# Patient Record
Sex: Female | Born: 1980 | Race: White | Hispanic: No | Marital: Married | Smoking: Former smoker
Health system: Southern US, Community
[De-identification: ages and names within clinical notes are randomized; demographics above are authoritative.]

## PROBLEM LIST (undated history)

## (undated) DIAGNOSIS — D649 Anemia, unspecified: Secondary | ICD-10-CM

## (undated) DIAGNOSIS — Z8489 Family history of other specified conditions: Secondary | ICD-10-CM

## (undated) DIAGNOSIS — N200 Calculus of kidney: Secondary | ICD-10-CM

## (undated) HISTORY — PX: OTHER SURGICAL HISTORY: SHX169

---

## 2001-02-13 ENCOUNTER — Encounter: Payer: Self-pay | Admitting: *Deleted

## 2001-02-13 ENCOUNTER — Emergency Department (HOSPITAL_COMMUNITY): Admission: EM | Admit: 2001-02-13 | Discharge: 2001-02-13 | Payer: Self-pay | Admitting: *Deleted

## 2002-11-18 ENCOUNTER — Encounter: Payer: Self-pay | Admitting: *Deleted

## 2002-11-18 ENCOUNTER — Ambulatory Visit (HOSPITAL_COMMUNITY): Admission: RE | Admit: 2002-11-18 | Discharge: 2002-11-18 | Payer: Self-pay | Admitting: *Deleted

## 2003-04-04 ENCOUNTER — Inpatient Hospital Stay (HOSPITAL_COMMUNITY): Admission: AD | Admit: 2003-04-04 | Discharge: 2003-04-07 | Payer: Self-pay | Admitting: *Deleted

## 2004-08-16 ENCOUNTER — Observation Stay (HOSPITAL_COMMUNITY): Admission: AD | Admit: 2004-08-16 | Discharge: 2004-08-17 | Payer: Self-pay | Admitting: *Deleted

## 2004-08-20 ENCOUNTER — Inpatient Hospital Stay (HOSPITAL_COMMUNITY): Admission: AD | Admit: 2004-08-20 | Discharge: 2004-08-22 | Payer: Self-pay | Admitting: Obstetrics & Gynecology

## 2004-09-20 ENCOUNTER — Other Ambulatory Visit: Admission: RE | Admit: 2004-09-20 | Discharge: 2004-09-20 | Payer: Self-pay | Admitting: *Deleted

## 2004-11-23 ENCOUNTER — Ambulatory Visit (HOSPITAL_COMMUNITY): Admission: RE | Admit: 2004-11-23 | Discharge: 2004-11-23 | Payer: Self-pay | Admitting: *Deleted

## 2005-01-09 ENCOUNTER — Ambulatory Visit (HOSPITAL_COMMUNITY): Admission: RE | Admit: 2005-01-09 | Discharge: 2005-01-09 | Payer: Self-pay | Admitting: *Deleted

## 2005-01-11 ENCOUNTER — Ambulatory Visit (HOSPITAL_COMMUNITY): Admission: RE | Admit: 2005-01-11 | Discharge: 2005-01-11 | Payer: Self-pay | Admitting: *Deleted

## 2005-01-13 ENCOUNTER — Inpatient Hospital Stay (HOSPITAL_COMMUNITY): Admission: AD | Admit: 2005-01-13 | Discharge: 2005-01-15 | Payer: Self-pay | Admitting: *Deleted

## 2005-02-24 ENCOUNTER — Observation Stay (HOSPITAL_COMMUNITY): Admission: AD | Admit: 2005-02-24 | Discharge: 2005-02-24 | Payer: Self-pay | Admitting: *Deleted

## 2005-03-23 ENCOUNTER — Inpatient Hospital Stay (HOSPITAL_COMMUNITY): Admission: RE | Admit: 2005-03-23 | Discharge: 2005-03-25 | Payer: Self-pay | Admitting: *Deleted

## 2008-03-17 ENCOUNTER — Other Ambulatory Visit: Admission: RE | Admit: 2008-03-17 | Discharge: 2008-03-17 | Payer: Self-pay | Admitting: Obstetrics & Gynecology

## 2010-10-24 ENCOUNTER — Emergency Department (HOSPITAL_COMMUNITY)
Admission: EM | Admit: 2010-10-24 | Discharge: 2010-10-24 | Payer: Self-pay | Source: Home / Self Care | Admitting: Emergency Medicine

## 2011-02-25 NOTE — Discharge Summary (Signed)
NAMECYRIL, [name]            ACCOUNT NO.:  [id number]   MEDICAL RECORD NO.:  [id number]          PATIENT TYPE:  OIB   LOCATION:  A415                          FACILITY:  [facility]   PHYSICIAN:  Langley Gauss, MD     DATE OF BIRTH:  31-Jul-1981   DATE OF ADMISSION:  02/24/2005  DATE OF DISCHARGE:  05/18/2006LH                                 DISCHARGE SUMMARY   The patient is a 30 year old gravida 2 para 1 at 36-1/[redacted] weeks gestation with  a previous low transverse Cesarean section.  She has been scheduled for a  repeat C section at [redacted] weeks gestation.  The patient is seen in the office  complaining of onset of uterine activity early this a.m., such that was  painful.  She is referred to labor and delivery for evaluation.   OB HISTORY:  Pertinent only for one prior C section.  She is scheduled for  repeat.   PHYSICAL EXAMINATION:  At times tearful with complaints of uterine  contractions.   UTERUS:  Soft, nontender.  Fundal height is 36, consistent with 35-36 weeks  estimated gestational age.  She is vertex presentation by Leopold's  maneuvers.  EXTREMITIES:  Normal.  PELVIC:  Normal external genitalia.  No lesions or ulcerations identified.  No vaginal bleeding or leakage of fluid.  CERVIX:  Fingertip vertex at -1 station.  Cervix is posterior.  She is  referred to labor and delivery at which time she is placed on external fetal  monitor with the indication of previous Cesarean section and threatened pre-  term labor.  A non stress test is requested.  Non stress test interpretation  reveals fetal heart rate baseline at 145, accelerations noted greater than  15 beats per minute x greater than 15 seconds duration.  No fetal heart rate  decelerations are noted.  Uterine activity initially irregular contractions  are noted, occurring q.3-8 minutes which are perceived by the patient.   HOSPITAL COURSE:  The patient initially treated with the Brethine protocol  0.25 mg subcu to  diminish uterine activity.  The patient is to be observed  to see whether she does spontaneously enter labor.  If labor would ensure,  we would be required to proceed with the repeat C section prior to the  scheduled date.  The patient did have some resolution of the uterine  activity with the Brethine, but it was not until she was treated with IV  fluids.  She did receive Lactated Ringer's 500 cubic centimeters/hour, given  1 liter over a 2 hour duration.  Baseline laboratory studies were requested  which did reveal evidence of iron deficiency anemia.  With the IV fluid  administration, the patient had near complete cessation of uterine activity.  Cervical examination is unchanged.  The patient states that the uterine  activity is now much, much, much less than on her initial presentation and  in fact, is only perceiving very  rare and occasional uterine activity.  She is thus discharged to home at  this time.  Signs and symptoms of labor as well as spontaneous rupture of  membranes reviewed  with the patient.  She will be keeping her next scheduled  OB outpatient appointment.       DC/MEDQ  D:  03/05/2005  T:  03/05/2005  Job:  [id number]

## 2011-02-25 NOTE — Discharge Summary (Signed)
   [name], [name]                      ACCOUNT NO.:  [id number]   MEDICAL RECORD NO.:  [id number]                   PATIENT TYPE:  INP   LOCATION:  A426                                 FACILITY:  [facility]   PHYSICIAN:  Langley Gauss, M.D.                DATE OF BIRTH:  07/25/1981   DATE OF ADMISSION:  04/04/2003  DATE OF DISCHARGE:  04/07/2003                                 DISCHARGE SUMMARY   DELIVERY PERFORMED:  Primary low transverse cesarean section delivery of 6  pound 8 ounce female infant with Apgars of 9 and 9.   INDICATION:  Prolonged fetal heart rate decelerations.   PROCEDURES:  6.504 include placement of epidural and amnioinfusion.   DISPOSITION:  At the time of discharge, the patient has a Pfannenstiel  incision.  She will be seen in the office in three days' time for removal of  staples.  Retention sutures and JP drain are removed prior to discharge.  The patient is given a copy of standard discharge instructions.   DISCHARGE MEDICATIONS:  Tylox, #30, without refill for postoperative pain  relief.   LABORATORY DATA:  Pertinent laboratory studies reveal A-positive blood type,  hemoglobin and hematocrit 9.8/28.0 with a white count of 9.5.  Postoperative  day #1, hemoglobin 9.0, hematocrit 26.1, with a white count of 13.7.   ADDITIONAL DIAGNOSES:  Iron deficiency anemia.   DISCHARGE INSTRUCTIONS:  The patient is to continue with Flintstones  vitamins increase to two p.o. daily, as she was unable to tolerate prenatal  vitamins or iron during the prenatal course.   HOSPITAL COURSE:  See previous dictations.  The patient underwent urgent  primary low transverse cesarean section on April 04, 2003.  Postoperatively,  the patient did very well.  She was able to ambulate.  Vital signs remained  stable.  After removal of the Foley catheter, she was able to void without  difficulty.  She began having gas pains on postoperative day #2 with  increased ambulation and  increase of fluid intake.  She was able to tolerate  a regular general diet on postoperative day #2.  On postoperative day #3,  the patient was fully ambulatory, was doing well with p.o. narcotics and was  thus discharged to home on April 07, 2003.  Infant circumcision was performed  on the p.m. of April 06, 2003 by Dr. Roylene Reason. Lisette Grinder.                                               Langley Gauss, M.D.    DC/MEDQ  D:  04/08/2003  T:  04/08/2003  Job:  [id number]

## 2011-02-25 NOTE — Discharge Summary (Signed)
[name], [name]            ACCOUNT NO.:  [id number]   MEDICAL RECORD NO.:  [id number]          PATIENT TYPE:  INP   LOCATION:  A428                          FACILITY:  [facility]   PHYSICIAN:  Langley Gauss, MD     DATE OF BIRTH:  October 10, 1981   DATE OF ADMISSION:  01/13/2005  DATE OF DISCHARGE:  04/08/2006LH                                 DISCHARGE SUMMARY   DIAGNOSES:  1.  Right pyelonephritis.  2.  Right flank pain with proximal dilatation of the ureter secondary to      number one.   CONSULTATIONS OBTAINED:  Dr. Rito Ehrlich, urology, who saw the patient on January 14, 2005 due to the proximal dilatation of the ureter.  His recommendations  were discussed with the patient.  The patient is currently at [redacted] weeks  gestation.  If the pain was incapacitating to the patient, he discussed  performance of a cystoscopy with placement of a ureteral stent.  However, at  that point in time on January 14, 2005, the patient had near complete  resolution of her flank pain.  Thus no additional urological procedure was  performed, but the patient was advised to follow up with urology as  clinically indicated.  A single-shot IVP with dye had been requested upon  the recommendation of Dr. Rito Ehrlich.  This would have been performed on January 14, 2005.  However, the patient went to the radiology department with a  pregnant status.  The radiologist apparently discussed with her the risks  associated with the procedure.  She declined to have it performed.   Discharge was January 15, 2005.   DISCHARGE MEDICATIONS:  The patient has prescriptions for Lortab at home  from earlier this week when she was seen in the office.  She also will  complete a course of Macrodantin 100 mg p.o. b.i.d.   Urine culture from this hospital stay was pending at the time of discharge.  Pertinently, 3 days previously, urine culture had revealed only 1000  colonies/ml.   Additional laboratory studies revealed a hemoglobin of 8.2,  hematocrit 24.3  with an MCV of 85.9, white count normal at 3.3.  BUN and creatinine within  normal limits.  The urinalysis was pertinent for moderate hemoglobin, a  small amount of bilirubin, positive nitrites, small esterase, many  epithelial cells and many bacteria.  There in addition was noted to be the  presence of calcium oxalate crystals.   ADDITIONAL DIAGNOSES:  Include anemia secondary to iron deficiency.  The  patient is prescribed at the time of discharge hemocyte F 1 p.o. daily,  number 30 with 0-2 refills.  She does state that she has been compliant with  her prenatal vitamins.   HOSPITAL COURSE:  The patient was seen and directly admitted from the office  dated January 13, 2005 with a presumptive diagnosis of right pyelonephritis,  and number two is possible right renal calculi.  The patient was treated  with IV Rocephin and was treated aggressively with fluid resuscitative  therapy.  An ultrasound of the kidneys was performed in the department of  radiology, which  did reveal normal-sized kidneys but proximal dilatation of  the right ureter.  The patient was continued on the course of management.  A  urological consultation was obtained on January 14, 2005.  Initially the  patient was treated with IV Buprenex for pain relief.  However, pain  markedly diminished with fluid resuscitative therapy as well as the IV  antibiotics such that by January 15, 2005 at discharge the patient was  completely pain free, afebrile, urinating without difficulty.  This in  combination with the proximal dilatation of the ureter, the flank pain and  the presence of calcium oxalate crystals on the UA certainly could be  consistent with the spontaneous passage of very small renal calculi.  Thus  she was discharged to home on January 15, 2005 to keep the next scheduled OB  appointment.  Contact us if she is unable to tolerate p.o. intake.   FINAL DIAGNOSES:  1.  Right pyelonephritis.  Culture currently  pending.  2.  Probable small right renal calculus with spontaneous resolution and      passage.   Hospital care services January 14, 2005.      DC/MEDQ  D:  01/16/2005  T:  01/16/2005  Job:  [id number]

## 2011-02-25 NOTE — H&P (Signed)
NAMEDAMARYS, [name]            ACCOUNT NO.:  [id number]   MEDICAL RECORD NO.:  [id number]          PATIENT TYPE:  INP   LOCATION:  A428                          FACILITY:  [facility]   PHYSICIAN:  Langley Gauss, MD     DATE OF BIRTH:  January 27, 1981   DATE OF ADMISSION:  01/13/2005  DATE OF DISCHARGE:  LH                                HISTORY & PHYSICAL   HISTORY:  A 30 year old old gravida 3, para 2 at [redacted] weeks gestation, who  came to the office for evaluation with a chief complaint of right posterior  back/flank pain, with some radiation around to the right lower quadrant.  The patient described the pain as being steady in nature and as increasing  over the past 72 hours.  She denies any associated GI symptoms but did vomit  x1 on the day of admission.  She has had some continued low grade nausea.  The pain is pretty much steady in nature and is not exacerbated  significantly by movement.  The patient has been working and whether she is  working or out of work the pain is markedly unabated.  Currently, four days  previously, the patient did present to labor and delivery complaining of  steady back and suprapubic pain.  The patient was determined not to be in  labor at time and was discharged to home.  Subsequently she was seen in the  office for a followup visit the following a.m., dated January 10, 2005.  At  that time the patient had no focal findings on examination.  No urinary  tract symptoms.  She did have some mild suprapubic pain.  An urine culture  done on that date subsequently was positive for 1000 colonies/mL.  At the  time of this dictation the ID of the organism was not available.  Currently  also on that date of service in the office, January 10, 2005, the patient's  urine dipstick was essentially unremarkable with all indices tested being  negative.   REVIEW OF SYSTEMS:  The patient has no prior history of kidney  abnormalities, specifically no prior hospitalizations or  outpatient  treatments for pyelonephritis.  She does not recall any significant  treatment for recurrent cystitis.  She is noted to have a positive Chlamydia  culture dated March 1998.  The patient was treated at that time and  subsequently has been cultured on at least five occasions, with the test of  cure always being negative for gonorrhea and negative for Chlamydia.  She  likewise has had negative cervical cultures done during this pregnancy.   PAST MEDICAL HISTORY:  1.  On April 04, 2003 a primary low transverse cesarean section.  2.  On June 10, 1997 a vaginal delivery with a 7 pound 2 ounce infant.  3.  The patient denied a history of abnormal Paps.  4.  On December 10, 2002 she was noted to have a CUS pap smear.  5.  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  6.  A biopsy done this  pregnancy is negative for dysplasia.   LABORATORY STUDIES:  Random laboratory studies A positive blood type,  hepatitis is negative, Rubella is immune. Hemoglobin on initial assessment  is 13.0.   PHYSICAL EXAMINATION:  GENERAL:  She is complaining of right flank pain but  does not appear to be uncomfortable.  VITAL SIGNS:  Blood pressure is 105/70, pulse rate of 80, respiratory rate  is 20.  HEENT:  Negative.  No adenopathy.  NECK:  Supple.  Thyroid is nonpalpable.  ABDOMEN:  There is some mild right lower quadrant tenderness appreciated but  marked right flank tenderness.  PELVIC:  Reveals normal external genitalia.  The cervix is noted to be  closed.  No vaginal bleeding, leakage of fluid, or abnormal discharge is  noted.   ADDITIONAL LABORATORY STUDIES:  Reveal a hemoglobin of 8.2, hematocrit 24.3  with a white count of 3.3.  Urine dipstick in the office was pertinent for  positive nitrites, positive esterase, and positive ketones.  Subsequent  laboratories at [facility] reveal a creatinine normal at 0.6, BUN  normal at 3.  A renal  ultrasound was performed, which reveals the kidneys to  be normal in size bilaterally.  The left kidney shows no hydronephrosis.  The right kidney reveals approximately ureteral dilation.  No dilatation  throughout the remainder of the length of the ureters.  Specifically, the  right ureter is identified as significant right hydronephrosis and  hydroureter suspicious for obstruction.  Unable to assess her presence of  ureteral jets due to an empty urinary bladder.  Urine dipstick and clean  catch UA obtained are pertinent for a specific gravity of 1.025, moderate  blood, a small amount of urine bilirubin, positive for urine on nitrites, a  small amount of leukocyte esterase.   ASSESSMENT/PLAN:  The patient at [redacted] weeks gestation with symptoms consistent  with right pyelonephritis, with the exception that she has not become  febrile.  She had had significant change in the urinalysis over the past 72  hours, going from a completely benign normal appearing urinalysis to that  which is suggestive of infection.  Thus, she is referred to [facility] on January 13, 2005.  She will receive fluid and hydration therapy.  In addition she will be treated with IV antibiotics.  She is given Rocephin  1 g IV q.12h.  Following admission to [facility] and obtaining a report  revealing a question of obstruction on the right, I did contact Dr.  Rito Ehrlich, who says he will see the patient on today's date, January 15, 2005.      DC/MEDQ  D:  01/14/2005  T:  01/14/2005  Job:  [id number]

## 2011-02-25 NOTE — Op Note (Signed)
[name], [name]                      ACCOUNT NO.:  [id number]   MEDICAL RECORD NO.:  [id number]                   PATIENT TYPE:  INP   LOCATION:  A426                                 FACILITY:  [facility]   PHYSICIAN:  Langley Gauss, M.D.                DATE OF BIRTH:  1981-07-04   DATE OF PROCEDURE:  04/04/2003  DATE OF DISCHARGE:                                 OPERATIVE REPORT   PREOPERATIVE DIAGNOSES:  1. Thirty-eight week intrauterine pregnancy and labor.  2. Prolonged fetal heart rate deceleration.   POSTOPERATIVE DIAGNOSES:  1. Thirty-eight week intrauterine pregnancy and labor.  2. Prolonged fetal heart rate deceleration.   PROCEDURE:  Emergent low primary low transverse cesarean section, delivery  of 6 pound 8 ounce female infant with Apgar of 9 and 9.   SURGEON:  Langley Gauss, M.D.   ANALGESIA:  Continuous lumbar epidural placed by Dr. Alwyn Ren  through the course of labor, managed by anesthesia in the OR.   PEDIATRICIAN:  Francoise Schaumann. Halm, D.O.   SPECIMENS:  Arterial cord gas and cord blood to the laboratory. The placenta  is examined and noted to be apparently intact with a three vessel umbilical  cord.   ESTIMATED BLOOD LOSS:  800 mL   DRAINS:  JP catheter in the subcutaneous space. A Foley catheter was placed  to straight drainage.   SUMMARY:  The patient has been admitted in the a.m. for induction of labor.  She is noted to be a gravida 2, para 1 with a prior history of rapid labor  with a short second stage. Amniotomy had been performed when the patient was  3 cm dilated, clear amniotic fluid was noted. Fetal scalp electrode  documented a reassuring fetal heart rate with onset of discomfort associated  with fetal contractions. The patient requested epidural. Epidural was placed  without difficulty, functioned very well and was noted to be 4 cm dilated  after placement of the epidural. Thereafter with continuation of Pitocin and  onset of  active labor, the patient had intermittent variable type  decelerations, each of these with good recovery following the contraction.  The long-term and short term variability remained reassuring. The patient  was followed very closely in the course of the labor with the expectation  that she may progress very rapidly in labor and that some of these  declarations may be associated with descent of the fetal vertex. In an  effort to alleviate some of these variable type decelerations, an  intrauterine pressure catheter was placed and an amnio infusion was  performed with installation of 1000 mL of sterile normal saline. This seemed  to transiently improve the fetal heart rate. However, thereafter it was  noted that these variable type decelerations recurred. The final onset of  events occurred at 17:48 at which time there was noted to be a fetal heart  rate deceleration of 60 beats/minute. Cervix  examined at that time and was  noted to be 7 cm dilated, 90% effaced with a vertex of zero station. The  fetal heart rate remained down in the range of 60 beats per minutes,  position change was tried trying the patient on the left side, right side,  pillow between the legs. Examination did not reveal any umbilical cord  prolapse. There was no hypotension, no vaginal bleeding was noted occur thus  no etiology was found for the fetal heart rate deceleration. The patient was  watched very closely as these maneuvers were being performed. When it  reached 9 minutes, fetal heart rate had been 60 beats per minutes, a  decision was made to proceed with a stat primary low transverse cesarean  section. The nursing supervisor was noted at 18:00 by the R.N. staff that we  would like to proceed with a stat low transverse cesarean section. At this  point in time, the patient was treated with 10 mL of 2% lidocaine through  her epidural to give her an additional excellent block with near complete  analgesia to a T8  level. I was making preparations such that the primary low  transverse cesarean section could be performed stat in the birthing room if  the fetal heart rate continued to have no response.  Alternatively, the  patient was to be taken down to the operating room. After 10 minutes, the  fetal heart rate was noted to have some improvement to 90 beats per minute.  The patient was shaved and preparations were being made should the cesarean  section be required in the birthing room. However, with the heart rate now  at 90 beats per minute it seemed as thought the overall trend was that the  heart rate was improving thus a decision was made to transfer the patient  down to the operating room on the first floor. This was done very  efficiently such that at 18:10, the patient was on the operating room table  in room #2 on the first floor with the fetal monitor reconnected. At this  time, the fetal heart rate was noted to have improved with a stable baseline  at 130 beats per minute and the absence of any uterine contractions. Thus at  18:10, the status of the patient was changed rather than needing to be a  stat cesarean section, the classification was rather changed to urgent. As  the operating room crew arrived and the CNA as well as Dr. Milford Cage,  pediatrician, they were notified of this change in status as the fetal heart  rate remained stable at 130 beats per minute. Thus all appropriate  preparations were made in the operating room.   The patient was sterilely prepped and draped in the usual manner and after  assurance of adequate surgical analgesia, the procedure was initiated at  18:41. A sharp knife was used to incise the Pfannenstiel incision through  the skin, dissect it down to the fascial plane utilizing a sharp knife  cauterizing bleeders along the way. The fascia was then incised in a transverse curvilinear manner utilizing the Mayo scissors while sharply  dissecting out the underlying  rectus muscles. The edges of the rectus fascia  were grasped using Kocher clamps and incised the underlying rectus muscle in  the midline utilizing Mayo scissors. The rectus muscles bluntly separated,  peritoneal cavity is atraumatically bluntly entered at the superior most  portion of the incision. Peritoneal incision extended superiorly and  inferiorly. Inferiorly we  directly visualized the bladder to avoid its  accidental injury. A bladder blade was then placed, lower uterine segment  was identified, a bladder flap is created from the vesicouterine fold  utilizing the Mayo scissors. The bladder flap is bluntly mobilized out of  the operative field, sharp knife was then used to incise a low transverse  uterine incision, intact amniotic sac was then cut in the midline with clear  amniotic fluid. My index finger was used to extend the uterine incision  bilaterally. The infant was identified and was noted to be partly flexed and  in an LOT position. The head of the infant was flexed and elevated to the  level of the uterine incision at which time the suction is placed on the  infant's vertex, connected to wall suction. Gentle traction provided with  fundal pressure and then we sought to deliver the infant vertex through the  incision at which time mouth and nares were bulb suctioned of clear amniotic  fluid. Renewed fundal pressure plus gentle extractive effort then resulted  in delivery of the remainder of the infant without difficulty. The umbilical  cord is milked towards the infant, cord is doubly clamped and cut and infant  is again bulb suctioned of clear amniotic fluid. The infant is handed to the  waiting pediatrician at which time there was noted to be a spontaneous and  vigorous breathing cry. Arterial cord gas and cord blood are then obtained  from the umbilical cord. Gentle traction on the umbilical cord resulted in  separation which upon examination appears to be an intact three  vessel cord  and placenta. The uterus was exteriorized, intrauterine exploration revealed  no retained placental fragments. The uterus was then closed in two layers  with #0 chromic in a running locked fashion to restore the normal anatomy.  Hemostasis being assured, the cul-de-sac is then irrigated free of all clots  and the uterus returned to the pelvic cavity. Sponge, needle and instrument  counts were correct at this time. The  __________ were grasped using Kelly  clamps and the peritoneum was closed with a continuous running #0 chromic  suture. Rectus muscles likewise reapproximated with #0 chromic running  suture, the fascia was closed with a continuous running #1 PDS suture. The  JP drain is placed over the subcutaneous space through the separate exit  wound into the left apex through the incision and the JP drain sutured in place. 30 cc of 0.5% bupivacaine plain injected into the skin incision  through a small skin wheal in an effort to facilitate postoperative  analgesia. Three horizontal mattress sutures of #1 PDS were serially placed  to reapproximate the skin edges along the incision and the skin is then  completely closed utilizing skin staples. The patient continues to drain  clear yellow urine and she is taken to the recovery room in stable  condition.   Of note, while the patient was in the labor room with the fetal heart rate  at 60 beats per minute, I had discussed with the patient's family the  urgency of the situation and made them aware that it may be necessary to  proceed with a cesarean section delivery in the labor room if it was felt  that the __________ room might change the baby's clinical status.  Langley Gauss, M.D.    DC/MEDQ  D:  04/04/2003  T:  04/05/2003  Job:  [id number]

## 2011-02-25 NOTE — H&P (Signed)
NAMEPERSIS, GRAFFIUS           ACCOUNT NO.:  [id number]   MEDICAL RECORD NO.:  [id number]          PATIENT TYPE:  AMB   LOCATION:                                FACILITY:  [facility]   PHYSICIAN:  Langley Gauss, MD     DATE OF BIRTH:  [birth year]   DATE OF ADMISSION:  03/23/2005  DATE OF DISCHARGE:  LH                                HISTORY & PHYSICAL   HISTORY OF PRESENT ILLNESS:  A 30 year old, gravida 3, para 2, at [redacted] weeks  gestation.  One prior low transverse cesarean section.  The patient is  admitted for repeat cesarean section.  She also desires permanent  sterilization, bilateral tubal ligation, to be performed intraoperatively.  She accepts the inherent 2% failure rate associated with the procedure, and  understands that it is to be considered a permanent and irreversible  procedure.  The patient's prenatal course has been uncomplicated.  Hepatitis  B is negative.  Rubella immune.  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  She did have a biopsy done which was negative for  dysplasia.  A positive blood type.  Antibody screen is negative.  Hepatitis  B is negative.  Rubella immune.  Serial ultrasounds have documented normal  anatomic survey and adequate fetal growth.  The patient did have several  admissions during the first trimester for hyperemesis gravidarum.  Subsequently, she has had weight gain from 130 to 160 pounds during the  pregnancy.   PAST MEDICAL HISTORY:  1.  June 10, 1997 - vaginal delivery, 7 pound, 2.3 ounce infant.  2.  April 04, 2003 - primary low transverse cesarean section, 6 pound, 8      ounce female infant, due to prolonged decelerations.  3.  The patient is noted to have a history of Chlamydia in March 1998,      August 1998, and October 1998.  She was treated on all those occasions.      Subsequently, all cultures during this pregnancy have been negative.   ALLERGIES:  She states she is allergic to -  1.  PENICILLIN with  an unknown reaction.  2.  SULFA with an unknown reaction.   SOCIAL HISTORY:  The patient is a nonsmoker.  Employed at [employer] in  [place].  Father of the baby, Jonny Ruiz, works for [employer].  Other  family members are noted to own [employer].   PHYSICAL EXAMINATION:  GENERAL:  In no acute distress.  VITAL SIGNS:  Height is 5 feet, 6 inches.  Weight is 160 pounds.  Blood  pressure is 99/65, pulse rate 80, respiratory rate 20.  HEENT:  Negative.  No adenopathy.  NECK:  Supple.  Thyroid is nonpalpable.  LUNGS:  Clear.  CARDIOVASCULAR:  Regular rate and rhythm.  ABDOMEN:  Soft and nontender.  Vertex presentation by Leopold's maneuvers.  Pfannenstiel incision noted.  Fundal height is 36 cm.  EXTREMITIES:  Noted to be normal.   Fetal heart tones are auscultated in the 150s.   ASSESSMENT AND PLAN:  The patient is admitted for repeat cesarean  section  and intraoperative tubal ligation, which will be performed on March 23, 2005.  The patient is advised that should labor onset occur prior to that, or  rupture of membranes, a surgical procedure will be performed at that time.       DC/MEDQ  D:  03/22/2005  T:  03/22/2005  Job:  [id number]

## 2011-02-25 NOTE — Consult Note (Signed)
[name], [name]            ACCOUNT NO.:  [id number]   MEDICAL RECORD NO.:  [id number]          PATIENT TYPE:  INP   LOCATION:  A428                          FACILITY:  [facility]   PHYSICIAN:  Dennie Maizes, M.D.   DATE OF BIRTH:  [birth year]   DATE OF CONSULTATION:  01/14/2005  DATE OF DISCHARGE:                                   CONSULTATION   REASON FOR CONSULTATION:  Right flank pain, right hydronephrosis, pregnancy.   CONSULTATION REPORT:  This 30 year old gravida 3, para 2 is [redacted] weeks  pregnant. She was evaluated in Dr. Preston Fleeting office for back pain as well as  suprapubic pain. Urinalysis was unremarkable, and urine culture and  sensitivity was negative for urinary tract infection. She started having  severe right back pain/flank pain with radiation of the pain to the right  lower quadrant of the abdomen. She came back to the hospital with persistent  pain. She also had nausea and vomiting. There is no history of fevers,  chills, voiding difficulty, or gross hematuria. A repeat urinalysis was  abnormal suggestive of urinary tract infection. Urine culture and  sensitivity have been done, and she has been started on IV Rocephin. Renal  ultrasound was done for further evaluation. This revealed significant right  hydronephrosis and hydroureter. The patient's past history is negative for  urinary tract infections or urolithiasis. She has not had any problems  during her prior pregnancies.   PAST MEDICAL HISTORY:  1.  Status post Cesarean section in June 2004.  2.  Vaginal delivery in 1998.  3.  History of abnormal Pap smear.  4.  History of Chlamydial infection in 1998.   PHYSICAL EXAMINATION:  ABDOMEN:  Soft. No palpable flank mass. Mild right  costal vertebral angle tenderness was noted. The patient is pregnant, 29  weeks.   ADMISSION LABORATORY DATA:  Urinalysis:  Hemoglobin moderate, nitrates  positive, leukocyte esterase small. WBCs 0 to 2, RBCs 3 to 6, bacteria  many.  Urine culture and sensitivity is pending. CBC:  WBC 3.3, hemoglobin 8.3,  hematocrit 24.3. BUN 3, creatinine 0.6.   IMPRESSION:  Right flank pain, right hydronephrosis, urinary tract  infection, pregnancy.   PLAN:  1.  Dr. Lisette Grinder scheduled a single film IVP for further evaluation. After      discussion with the radiologist, the patient has decided not to have      this x-ray.  2.  I discussed with the patient regarding the diagnosis and management      options. She is symptom free at present. She can be discharged when      ready by Dr. Lisette Grinder.  3.  Urine culture and sensitivity still pending. She will need appropriate      antibiotic therapy because it is positive.  4.  The patient is pain free at present. If she has severe persistent pain,      fever, or chills, she may need cystoscopy and a ureteral stent insertion      with ultrasound guidance. Return to office p.r.n.   Thanks for this consult.      SK/MEDQ  D:  01/14/2005  T:  01/14/2005  Job:  [id number]

## 2011-02-25 NOTE — H&P (Signed)
NAMEGENOLA, YUILLE                        ACCOUNT NO.:  [id number]   MEDICAL RECORD NO.:  [id number]                  PATIENT TYPE:   LOCATION:                                       FACILITY:   PHYSICIAN:  Langley Gauss, M.D.                DATE OF BIRTH:   DATE OF ADMISSION:  04/04/2003  DATE OF DISCHARGE:  04/07/2003                                HISTORY & PHYSICAL   HISTORY:  A 30 year old gravida 2, para 1 at [redacted] weeks gestation is admitted  for induction of labor secondary to psychosocial factors.  In addition, the  patient is noted to have a history of previous rapid labor with a very short  second stage.  The patient's prenatal course noted to be uncomplicated.  She  is noted to have A+ blood type, RPR is nonreactive, glucose tolerance test  is normal at 134.  The patient has had serial ultrasounds which have  documented adequate fetal growth.  Pertinently, in the patient's past  medical history she did have a positive history of Chlamydia during her  pregnancy in 1998.  Subsequent cultures done in 1998, December 2003, and  March 2004 have all been negative for both GC and Chlamydia.   ALLERGIES:  1. SULFA which gives her a rash.  2. PENICILLIN gives her hives.   PAST MEDICAL AND SURGICAL HISTORY:  No other medical or surgical history.   OBSTETRICS HISTORY:  June 10, 1997 vaginal delivery of 7 pound 2.3 ounce  female infant utilizing epidural, delivered over a midline episiotomy.  The  patient had no intrapartum or postpartum complications.   SOCIAL HISTORY:  The patient smokes one pack per day.  She is employed at  [employer].  Father of the baby is named Publishing copy and is employed by  [employer].   PHYSICAL EXAMINATION:  GENERAL:  No acute distress.  VITAL SIGNS:  Height 5 feet 1 inch, prepregnancy of 125, today 152, 105/64,  pulse of 80, respiratory rate is 20.  HEENT:  Negative.  No adenopathy.  NECK:  Supple.  Thyroid is nonpalpable.  LUNGS:   Clear.  CARDIOVASCULAR:  Regular rate and rhythm.  ABDOMEN:  Soft and nontender.  No surgical scars are identified.  Vertex  presentation by Leopold's maneuver, 36 cm fundal height.  PELVIC:  Normal external genitalia.  No lesions or ulcerations identified.  No vaginal bleeding or leakage of fluid.  Cervix 3 cm dilated, 70% effaced,  0 station, vertex, well applied to the cervix.  Fetal heart tones  auscultated in the 150s.    ASSESSMENT:  Very favorable cervix, thus on April 04, 2003 we will proceed  with amniotomy, thereafter contraction adequacy to be assessed and the  patient to receive Pitocin augmentation or induction as clinically  indicated.  She does plan on both bottle and breast feeding.  She would like  to utilize birth control pills for postpartum birth  control purposes,  utilizing Dr. Lilyan Punt for newborn pediatric care.                                               Langley Gauss, M.D.    DC/MEDQ  D:  04/02/2003  T:  04/02/2003  Job:  [id number]

## 2011-02-25 NOTE — Discharge Summary (Signed)
[name], [name]            ACCOUNT NO.:  [id number]   MEDICAL RECORD NO.:  [id number]          PATIENT TYPE:  INP   LOCATION:  A411                          FACILITY:  [facility]   PHYSICIAN:  Langley Gauss, MD     DATE OF BIRTH:  Mar 07, 1981   DATE OF ADMISSION:  03/23/2005  DATE OF DISCHARGE:  06/16/2006LH                                 DISCHARGE SUMMARY   PROCEDURES:  On June 14, repeat C-section of intraoperative tubal ligation.   DIAGNOSES:  1.  A 39-week intrauterine pregnancy, previous low transverse cesarean      section.  2.  The patient desires permanent sterilization.  3.  Anemia secondary to iron deficiency and blood loss.   LABORATORY STUDIES:  Admission hemoglobin 9.4/27.9 with white count 8.2.  Postoperative day #1, 8.1/24.0 with white count of 9.0, A positive blood  type.   DISCHARGE INSTRUCTIONS:  Follow up in the office in 12 weeks time for staple  removal from Pfannenstiel incision.  Dressing and JP drain are removed prior  to discharge.   HOSPITAL COURSE:  The patient admitted March 23, 2005, for planned repeat  cesarean section.  Utilizing spinal, this was performed without  complications.  In addition, bilateral tubal ligation was performed.  Postoperatively, the patient did well.  She remained afebrile.  Vital signs  remained stable.  She was fully ambulatory.  She was able to void well on  postoperative day #1 with Foley catheter removed.  Subsequently began taking  p.o. medications.  She did have some itching with the Tylox, but as long as  she took 25 mg of p.o. Benadryl with each dose she did well.  She was seen  on rounds in the late p.m. of March 24, 2005, at which time she was in  excellent condition and prepared for discharge March 25, 2005, in the a.m.  Currently pending and ordered at time of discharge is repeat CBC and HIV  titer.  The patient likely had HIV titer done with her prenatal profile  which subsequently undoubtedly was negative.  The  patient does have iron  supplement therapy at home, available for use.  However, she seems to do  poorly with all different iron formulation stride.       DC/MEDQ  D:  03/25/2005  T:  03/25/2005  Job:  [id number]

## 2011-02-25 NOTE — Op Note (Signed)
[name], KIL           ACCOUNT NO.:  [id number]   MEDICAL RECORD NO.:  [id number]           PATIENT TYPE:   LOCATION:                                 FACILITY:   PHYSICIAN:  Langley Gauss, MD          DATE OF BIRTH:   DATE OF PROCEDURE:  03/23/2005  DATE OF DISCHARGE:                                 OPERATIVE REPORT   DIAGNOSES:  1.  Thirty-nine week pregnancy.  2.  Previous Cesarean section.  3.  Desires permanent sterilization.   PROCEDURE:  1.  Repeat cesarean section, delivery of 8 pounds 4 ounces female infant.  2.  Modified Pomeroy intraoperative tubal ligation.   SURGEON:  Dr. Roylene Reason. Lisette Grinder.   ANALGESIC:  Spinal.   ESTIMATED BLOOD LOSS:  1000 cc.   DRAINS:  Foley catheter to straight drainage. JP within the subcutaneous  space.   SUMMARY:  Vital signs are stable. Planned procedure discussed with the  patient in the immediate preoperative area. The patient was taken to the  operating room where she was placed in the seated position. Spinal analgesic  administered per anesthesia without difficulty. Foley catheter placed to  straight drain, findings of clear yellow urine. The patient is then placed  with a slight left lateral tilt, prepped and draped usual sterile manner.  After assurance of adequate surgical analgesia, knife is used to excise the  previous Pfannenstiel incision, dissected down to the fascial plane  utilizing cautery and sharp knife. Bleeders were cauterized. The fascia was  then incised in transverse curvilinear manner while dissecting off the  underlying rectus muscle in the avascular plane. Fascial edges were grasped  using straight Kocher clamps. Fascia was dissected off the underlying rectus  muscle in the midline, both superiorly and inferiorly, rectus muscles  bluntly separated. Peritoneal cavity was atraumatically entered. Utilizing  my index fingers, peritoneal incision was extended superiorly and  inferiorly. Inferiorly,  bladder was visualized. Bladder blade was then  placed. Bladder flap was created from the vesicouterine fold and dissected  off the anterior lower uterine segment. Knife was used to score a low  transverse uterine incision. Index finger was used to bluntly extend the low  transverse uterine incision. Intact amniotic sac was encountered. Artificial  rupture of membranes was performed with an Allis clamp clear. Amniotic fluid  was noted. My right hand reached into the uterine cavity. Head of the infant  was flexed and elevated to the level of the incision. The Silastic suction  was then placed on the infant's vertex and connected to wall suction. Gentle  traction results in very easy delivery of the head through the uterine  incision. Mouth and nares were bulb suctioned of clear amniotic fluid.  Gentle traction combined with fundal pressure results in delivery of the  remainder the infant without difficulty. Umbilical cord is milked toward the  infant, and cord was doubly clamped and cut and handed to waiting  pediatrician, Dr. Vivia Ewing. Cord gas and cord blood was then obtained.  Gentle traction on the umbilical cord resulted in separation which upon  examination was  noted be intact placenta with associated three-vessel  umbilical cord. There was noted to be a true knot in the umbilical cord.  This was sent of for pathological study. Intrauterine exploration reveals no  retained placental fragments. The uterus was then exteriorized. Examination  of the uterus revealed no extensions of the uterine incision. The uterine  incision was closed in a single layer of 0 chromic in a running locked  fashion which results in excellent hemostasis. Cul-de-sac was irrigated free  of all clots. Tubal ligation was then performed as follows:  Each of the  tubes was identified by tracing them to their fimbriated ends. Each of the  tubes was then elevated in its mid portion, and two ties of #1 plain suture   were placed at the base of the loop of tube formed. Knuckle of right  fallopian tube and left fallopian tube were then excised. Sutures placed  through the right. Pedicles were dry. Uterine incision again was noted to  have excellent hemostasis. The uterus was then returned to the pelvic  cavity. Sponge and instrument counts were correct x2 at this time. Gutters  were irrigated free of all clots. Peritoneal edges were grasped using Kelly  clamps. Peritoneum and overlying rectus muscles were closed utilizing 0  chromic running suture. The fascia was then closed with a continuous running  #1 PDS suture. Subcutaneous bleeders were cauterized. JP drain was placed in  the subcutaneous space with a separate exit wound to the left apex of the  incision. This was sutured in place. Three horizontal mattress sutures of #1  PDS were then placed near the skin incision. Skin was then completely closed  utilizing skin staples. The patient tolerated the procedure very well. She  was taken to recovery room in stable condition. The patient does plan on  breast feeding       DC/MEDQ  D:  03/23/2005  T:  03/23/2005  Job:  [id number]

## 2011-02-25 NOTE — Discharge Summary (Signed)
[name], [name]            ACCOUNT NO.:  [id number]   MEDICAL RECORD NO.:  [id number]          PATIENT TYPE:  OIB   LOCATION:  A415                          FACILITY:  [facility]   PHYSICIAN:  Langley Gauss, MD     DATE OF BIRTH:  1980-12-04   DATE OF ADMISSION:  01/09/2005  DATE OF DISCHARGE:  04/02/2006LH                                 DISCHARGE SUMMARY   HISTORY OF PRESENT ILLNESS:  A 30 year old gravida 3, para 2, at [redacted] weeks  gestation who presents to [facility] with a chief  complaint of lower abdominal and back pain.  The patient has had these  intermittent complaints throughout the pregnancy, but states that they  increased during the day.  She denies any vaginal bleeding, any leakage of  fluid, or any urinary tract symptoms.  She denies any pelvic contraction  history, most specifically, she denies any tightening or loosening, denies  any menstrual-type cramping or pain.  She does report good fetal movement.  She has had no prenatal problems to date.   OBSTETRICAL HISTORY:  1.  June 10, 1997, vaginal delivery of a 7 pound 2.3 ounce infant.  2.  April 04, 2003, primary low transverse cesarean section delivered a 6      pound 8 ounce female infant.  Cesarean section done with an indication of      prolonged fetal heart rate deceleration.   PAST MEDICAL HISTORY:  1.  Previous positive Chlamydia which was treated.  This was three years      previously.  Subsequently, she had negative test cure, and during this      pregnancy has again had repeat cultures done which were negative for      gonorrhea and negative Chlamydia.  Subsequently, she also had an      abnormal Pap smear.  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  Pap smear this      pregnancy, August 31, 2004, was pertinent for CIN-II and CIN-III.      Subsequent colposcopic directed biopsy was performed which was pertinent  for no evidence of any active dysplasia.  2.  She did have significant first trimester hyperemesis, has been      hospitalized x2 for that.  This has now largely resolved.  At that time,      she was treated with p.o. Zofran for relief.  3.  She has had serial ultrasounds with most recent performed on November 23, 2004, dating her at 21-5/[redacted] weeks gestational age.  Spine was poorly      visualized, anterior placenta without placenta previa.   ALLERGIES:  1.  PENICILLIN.  2.  SULFA.   Treated previously for the positive Chlamydia with Zithromax.   SOCIAL HISTORY:  The patient is employed at [employer] in [place].  Father  of the baby, Jonny Ruiz, works for _________[employer].  She is a nonsmoker.   PHYSICAL EXAMINATION:  GENERAL:  She is noted to be in no acute distress.  VITAL  SIGNS:  Blood pressure 103/64, pulse rate is 74, respiratory rate 20.  HEENT:  Negative, no adenopathy.  NECK:  Supple, thyroid is nonpalpable.  LUNGS:  Clear.  CARDIOVASCULAR:  Regular rate and rhythm.  ABDOMEN:  Soft and nontender.  Specifically, normal uterine tone is  appreciated.  Fundal height is measured at 30 cm.  She has vertex  presentation by Leopold's maneuvers.  Prior cesarean section scar is noted,  Pfannenstiel incision.  EXTREMITIES:  Normal.  PELVIC:  Normal external genitalia, no lesions or ulcerations identified.  Cervix is noted to be posterior and closed, presenting part is not engaged.  No bladder tenderness is appreciated upon examination.   LABORATORY DATA:  Urine dip stick on labor and delivery negative for white  cells, negative nitrites, negative esterase.   ASSESSMENT AND PLAN:  1.  A 32 week intrauterine pregnancy, previous cesarean section.  No      evidence of any uterine activity by history or by physical examination.      Due to the patient's previous cesarean section, a non-stress test is      requested which reveals no uterine activity identified on external fetal       monitoring.  Fetal heart rate is noted to be 150, with accelerations      noted greater than 15 beats per minute for greater than 15 seconds      duration normal long-term variability, no fetal heart rate decelerations      are noted.  Additional diagnostic studies:  Non-stress test is reactive      dated January 09, 2005.  Follow up and repeat NST only as clinically      indicated.  2.  The patient with complaints of back and abdominal pain intermittently      throughout the pregnancy, now worsening due to advancing gestational      age.  She is noted to have no evidence of any uterine activity and no      cervical change.  The patient is planning on having a repeat cesarean      section performed at 38 or [redacted] weeks gestation, this has already been      scheduled.  The patient is not in labor at time.  Signs and symptoms of      labor as well as spontaneous rupture of membranes reviewed with the      patient.  The patient will continue doing fetal kick counts.  She is      treated for therapeutic rest with 10 mg of p.o. Ambien and discharged      home to follow up with next appointment or as clinically indicated this      week.      DC/MEDQ  D:  01/10/2005  T:  01/10/2005  Job:  [id number]

## 2011-02-25 NOTE — Op Note (Signed)
   [name], [name]                      ACCOUNT NO.:  [id number]   MEDICAL RECORD NO.:  [id number]                   PATIENT TYPE:  INP   LOCATION:  LDR1                                 FACILITY:  [facility]   PHYSICIAN:  Langley Gauss, M.D.                DATE OF BIRTH:  1981/03/28   DATE OF PROCEDURE:  04/04/2003  DATE OF DISCHARGE:                                 OPERATIVE REPORT   OBSTETRICAL PROCEDURE NOTE:   PROCEDURE:  Placement of continuous lumbar epidural analgesia by Dr. Roylene Reason. Lisette Grinder.   COMPLICATIONS:  None.   SUMMARY:  An appropriate and informed consent obtained.  The patient has had  epidural placed with previous labor and delivery.  The patient was placed in  a seated position, bony landmarks were identified.  Continuous electronic  fetal monitoring is performed.  The L2-L3 interspace was chosen.  The  patient's back was sterilely prepped and draped utilizing the epidural kit;  5 cc of 1% lidocaine plain then injected at the midline of the L2-L3  interspace to raise a small skin wheal.   The 17-gauge Touhy-Schliff needle was then utilized with loss of resistance  in air-filled glass syringe to identify entry into the epidural space on the  first attempt without difficulty.  Initial test dose of 5 cc of 1.5%  lidocaine plus epinephrine injected through the epidural needle.  No signs  of CSF or intravascular injection obtained.  Thus the catheter is inserted  to a depth of 5 cm.  Needle is removed.  Aspiration test is negative.  Second test dose of 3 cc of 1.5% lidocaine plus epinephrine injected through  the epidural catheter.  Again, no signs of CSF or intravascular injection  obtained.   Thus, the catheter is secured into place.  The patient is connected to the  infusion pump, containing the standard mixture.  She will be treated with a  bolus of 10 cc followed by continuous infusion rate of 12 cc/hour.  At the  completion of the procedure the patient  is examined.  She is noted to be 4  cm dilated, 80% effaced with the vertex now at 0 station. She continues to  leak clear amniotic fluid and is noted to have a reassuring fetal heart  rate.  The contractions are noted to be adequate contracting every 3 minutes  of moderate intensity by external toco only.                                               Langley Gauss, M.D.    DC/MEDQ  D:  04/04/2003  T:  04/04/2003  Job:  [id number]

## 2011-11-21 ENCOUNTER — Emergency Department (HOSPITAL_COMMUNITY): Payer: No Typology Code available for payment source

## 2011-11-21 ENCOUNTER — Emergency Department (HOSPITAL_COMMUNITY)
Admission: EM | Admit: 2011-11-21 | Discharge: 2011-11-21 | Disposition: A | Discharge status: Home / Self Care | Payer: No Typology Code available for payment source | Attending: Emergency Medicine | Admitting: Emergency Medicine

## 2011-11-21 ENCOUNTER — Encounter (HOSPITAL_COMMUNITY): Payer: Self-pay | Admitting: *Deleted

## 2011-11-21 DIAGNOSIS — M542 Cervicalgia: Secondary | ICD-10-CM | POA: Insufficient documentation

## 2011-11-21 DIAGNOSIS — F329 Major depressive disorder, single episode, unspecified: Secondary | ICD-10-CM | POA: Insufficient documentation

## 2011-11-21 DIAGNOSIS — IMO0002 Reserved for concepts with insufficient information to code with codable children: Secondary | ICD-10-CM | POA: Insufficient documentation

## 2011-11-21 DIAGNOSIS — M545 Low back pain, unspecified: Secondary | ICD-10-CM | POA: Insufficient documentation

## 2011-11-21 DIAGNOSIS — F3289 Other specified depressive episodes: Secondary | ICD-10-CM | POA: Insufficient documentation

## 2011-11-21 DIAGNOSIS — Y92410 Unspecified street and highway as the place of occurrence of the external cause: Secondary | ICD-10-CM | POA: Insufficient documentation

## 2011-11-21 DIAGNOSIS — M546 Pain in thoracic spine: Secondary | ICD-10-CM | POA: Insufficient documentation

## 2011-11-21 DIAGNOSIS — T148XXA Other injury of unspecified body region, initial encounter: Secondary | ICD-10-CM

## 2011-11-21 MED ORDER — HYDROCODONE-ACETAMINOPHEN 5-325 MG PO TABS: 1.0000 | ORAL_TABLET | ORAL | Status: AC | PRN

## 2011-11-21 MED ORDER — IBUPROFEN 600 MG PO TABS: 600.0000 mg | ORAL_TABLET | Freq: Four times a day (QID) | ORAL | Status: AC | PRN
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
## 2011-11-21 NOTE — ED Notes (Signed)
Patient clothes removed, placed in gown for x-rays.

## 2011-11-21 NOTE — ED Provider Notes (Signed)
Medical screening examination/treatment/procedure(s) were performed by non-physician practitioner and as supervising physician I was immediately available for consultation/collaboration.   Woodward Klem R Nicholaus Steinke, MD 11/21/11 1508 
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
## 2011-11-21 NOTE — ED Notes (Signed)
Patient removed from LSB by EDPA.
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
## 2013-06-13 ENCOUNTER — Ambulatory Visit (INDEPENDENT_AMBULATORY_CARE_PROVIDER_SITE_OTHER): Payer: Self-pay | Admitting: Nurse Practitioner

## 2013-06-13 ENCOUNTER — Encounter: Payer: Self-pay | Admitting: Family Medicine

## 2013-06-13 ENCOUNTER — Encounter: Payer: Self-pay | Admitting: Nurse Practitioner

## 2013-06-13 VITALS — BP 108/60 | Temp 98.6°F | Ht 64.0 in | Wt 149.2 lb

## 2013-06-13 DIAGNOSIS — J209 Acute bronchitis, unspecified: Secondary | ICD-10-CM

## 2013-06-13 DIAGNOSIS — J069 Acute upper respiratory infection, unspecified: Secondary | ICD-10-CM

## 2013-06-13 MED ORDER — CIPROFLOXACIN HCL 500 MG PO TABS: 500.0000 mg | ORAL_TABLET | Freq: Two times a day (BID) | ORAL | Status: DC

## 2013-06-13 NOTE — Progress Notes (Signed)
Subjective:  Presents for c/o cough, low grade fever and headache that began yesterday.  Occas cough nonprod. CP with cough.  Generalized headache.  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  No ear pain, no sore throat.  Head congestion and ear pressure.  No tick bites.  No rash.    Objective:   BP 108/60  Temp(Src) 98.6 F (37 C)  Ht 5\' 4"  (1.626 m)  Wt 149 lb 3.2 oz (67.677 kg)  BMI 25.6 kg/m2 NAD.  Alert, oriented.  TMs clear effusion.  Pharynx injected with PND noted.  Neck supple with mild adenopathy.  Lungs scattered exp crackles.  Slightly diminished  BS.  No wheezing or tachypnea. Heart RRR.  Assessment: Acute upper respiratory infections of unspecified site  Acute bronchitis probable viral illness Plan:  Meds ordered this encounter  Medications  . ciprofloxacin (CIPRO) 500 MG tablet    Sig: Take 1 tablet (500 mg total) by mouth 2 (two) times daily.    Dispense:  20 tablet    Refill:  0    Order Specific Question:  Supervising Provider    Answer:  Merlyn Albert [[id number]]   OTC meds as directed for congestion.  Call back if worsens or persists.

## 2013-08-14 ENCOUNTER — Ambulatory Visit (INDEPENDENT_AMBULATORY_CARE_PROVIDER_SITE_OTHER): Payer: BC Managed Care – PPO | Admitting: Family Medicine

## 2013-08-14 ENCOUNTER — Encounter: Payer: Self-pay | Admitting: Family Medicine

## 2013-08-14 VITALS — BP 118/78 | Temp 98.4°F | Ht 62.0 in | Wt 149.4 lb

## 2013-08-14 DIAGNOSIS — J209 Acute bronchitis, unspecified: Secondary | ICD-10-CM

## 2013-08-14 MED ORDER — ALBUTEROL SULFATE HFA 108 (90 BASE) MCG/ACT IN AERS: 2.0000 | INHALATION_SPRAY | Freq: Four times a day (QID) | RESPIRATORY_TRACT | Status: DC | PRN

## 2013-08-14 MED ORDER — LEVOFLOXACIN 500 MG PO TABS: 500.0000 mg | ORAL_TABLET | Freq: Every day | ORAL | Status: DC

## 2013-08-14 NOTE — Progress Notes (Signed)
  Subjective:    Patient ID: [name], female    DOB: 04/08/1981, 32 y.o.   MRN: [id number]  Cough This is a new problem. The current episode started yesterday. The problem occurs every few minutes. The cough is productive of sputum. Associated symptoms include ear congestion, ear pain, headaches, nasal congestion, postnasal drip, rhinorrhea and wheezing. The symptoms are aggravated by lying down. She has tried nothing for the symptoms.   Nose burning three d ago. yest bad sneezing  bilat ear pain  Coughing pos gunky  Wheezy at times, bad coughing spell  Pos smoker  No fever   Review of Systems  HENT: Positive for ear pain, postnasal drip and rhinorrhea.   Respiratory: Positive for cough and wheezing.   Neurological: Positive for headaches.       Objective:   Physical Exam  Alert HEENT moderate nasal congetion. Frontal tenderness. Pharynx erythematous lungs rare rhonchi heart regular in rhythm      Assessment & Plan:  Impression rhinosinusitis and bronchitis plan encouraged to stop smoking. Levaquin 500 daily 10 days. Symptomatic care discussed. Ventolin 2 sprays every 4 hours when necessary for wheezes. WSL

## 2013-08-16 ENCOUNTER — Telehealth: Payer: Self-pay | Admitting: Family Medicine

## 2013-08-16 MED ORDER — DOXYCYCLINE HYCLATE 100 MG PO TABS: 100.0000 mg | ORAL_TABLET | Freq: Two times a day (BID) | ORAL | Status: DC

## 2013-08-16 NOTE — Telephone Encounter (Signed)
Stop lev, document sensitivity. Doxy 100 bid ten d

## 2013-08-16 NOTE — Telephone Encounter (Signed)
Patient was seen on Wednesday August 14, 2013 and was given levofloxacin (LEVAQUIN) 500 MG tablet and albuterol (PROVENTIL HFA;VENTOLIN HFA) 108 (90 BASE) MCG/ACT inhaler.  States when she takes the Levaquin she gets Headache, Nausea.  Wants to know if there is anything else that could be prescribed.  [facility].

## 2013-08-16 NOTE — Telephone Encounter (Signed)
Rx sent electronically to pharmacy. Patient notified. 

## 2013-10-04 ENCOUNTER — Ambulatory Visit (INDEPENDENT_AMBULATORY_CARE_PROVIDER_SITE_OTHER): Payer: BC Managed Care – PPO | Admitting: Family Medicine

## 2013-10-04 ENCOUNTER — Encounter: Payer: Self-pay | Admitting: Family Medicine

## 2013-10-04 VITALS — BP 110/70 | Temp 98.2°F | Ht 62.0 in | Wt 154.1 lb

## 2013-10-04 DIAGNOSIS — J019 Acute sinusitis, unspecified: Secondary | ICD-10-CM

## 2013-10-04 MED ORDER — AZITHROMYCIN 250 MG PO TABS: ORAL_TABLET | ORAL | Status: DC

## 2013-10-04 MED ORDER — HYDROCODONE-HOMATROPINE 5-1.5 MG/5ML PO SYRP
5.0000 mL | ORAL_SOLUTION | Freq: Four times a day (QID) | ORAL | Status: DC | PRN
Start: 2013-10-04 — End: 2013-11-28

## 2013-10-04 NOTE — Progress Notes (Signed)
   Subjective:    Patient ID: [name], female    DOB: 19-Aug-1981, 32 y.o.   MRN: [id number]  Cough This is a new problem. The current episode started in the past 7 days. The problem has been unchanged. The problem occurs constantly. The cough is non-productive. Associated symptoms include headaches and rhinorrhea. Pertinent negatives include no chest pain, ear pain, fever, shortness of breath or wheezing. Associated symptoms comments: congestion. Nothing aggravates the symptoms. She has tried OTC cough suppressant for the symptoms. The treatment provided no relief.   PMH benign patient smokes   Review of Systems  Constitutional: Negative for fever and activity change.  HENT: Positive for congestion and rhinorrhea. Negative for ear pain.   Eyes: Negative for discharge.  Respiratory: Positive for cough. Negative for shortness of breath and wheezing.   Cardiovascular: Negative for chest pain.  Neurological: Positive for headaches.       Objective:   Physical Exam  Nursing note and vitals reviewed. Constitutional: She appears well-developed.  HENT:  Head: Normocephalic.  Nose: Nose normal.  Mouth/Throat: Oropharynx is clear and moist. No oropharyngeal exudate.  Neck: Neck supple.  Cardiovascular: Normal rate and normal heart sounds.   No murmur heard. Pulmonary/Chest: Effort normal and breath sounds normal. She has no wheezes.  Lymphadenopathy:    She has no cervical adenopathy.  Skin: Skin is warm and dry.          Assessment & Plan:  URI- viral Acute sinusitis-Z-Pak, Hycodan warning signs discussed call if ongoing trouble

## 2013-11-28 ENCOUNTER — Encounter: Payer: Self-pay | Admitting: Family Medicine

## 2013-11-28 ENCOUNTER — Ambulatory Visit (INDEPENDENT_AMBULATORY_CARE_PROVIDER_SITE_OTHER): Payer: BC Managed Care – PPO | Admitting: Family Medicine

## 2013-11-28 VITALS — BP 100/80 | Temp 98.2°F | Ht 62.0 in | Wt 160.2 lb

## 2013-11-28 DIAGNOSIS — J019 Acute sinusitis, unspecified: Secondary | ICD-10-CM

## 2013-11-28 MED ORDER — AZITHROMYCIN 250 MG PO TABS: ORAL_TABLET | ORAL | Status: DC

## 2013-11-28 NOTE — Progress Notes (Signed)
   Subjective:    Patient ID: [name], female    DOB: 08/18/1981, 33 y.o.   MRN: [id number]  URI  This is a new problem. The current episode started in the past 7 days. The problem has been unchanged. There has been no fever. Associated symptoms include congestion, coughing, headaches and sneezing. She has tried decongestant for the symptoms. The treatment provided no relief.  Tuesday head congestion Wed- chest congestion No fever/chills No N-V Feels tight in chest but denies wheezing PMH benign  Review of Systems  HENT: Positive for congestion and sneezing.   Respiratory: Positive for cough.   Neurological: Positive for headaches.       Objective:   Physical Exam  Lungs clear heart regular neck no masses eardrums normal abdomen soft extremities no edema mild sinus tenderness      Assessment & Plan:  Viral URI with secondary sinusitis antibiotics prescribed warning signs discussed followup if ongoing trouble.

## 2014-03-05 ENCOUNTER — Ambulatory Visit (INDEPENDENT_AMBULATORY_CARE_PROVIDER_SITE_OTHER): Payer: BC Managed Care – PPO | Admitting: Nurse Practitioner

## 2014-03-05 ENCOUNTER — Encounter: Payer: Self-pay | Admitting: Nurse Practitioner

## 2014-03-05 ENCOUNTER — Encounter: Payer: Self-pay | Admitting: Family Medicine

## 2014-03-05 VITALS — BP 128/82 | Temp 98.5°F | Ht 64.0 in | Wt 161.8 lb

## 2014-03-05 DIAGNOSIS — J310 Chronic rhinitis: Secondary | ICD-10-CM

## 2014-03-05 DIAGNOSIS — J329 Chronic sinusitis, unspecified: Secondary | ICD-10-CM

## 2014-03-05 MED ORDER — AZITHROMYCIN 250 MG PO TABS: ORAL_TABLET | ORAL | Status: DC

## 2014-03-05 NOTE — Patient Instructions (Signed)
OTC antihistamine Nasacort AQ as directed 

## 2014-03-08 ENCOUNTER — Encounter: Payer: Self-pay | Admitting: Nurse Practitioner

## 2014-03-08 NOTE — Progress Notes (Signed)
Subjective:  Presents for c/o cough and congestion for the past 5 days. Cough worse last night. Nonstop since 3 am. Now producing green mucus with bad taste. No fever. Frontal area headache. No sore throat. Rare ear pain. No wheezing. Smoker. No vomiting, diarrhea or abd pain.  Objective:   BP 128/82  Temp(Src) 98.5 F (36.9 C)  Ht 5\' 4"  (1.626 m)  Wt 161 lb 12.8 oz (73.392 kg)  BMI 27.76 kg/m2 NAD. Alert, oriented. TMs clear effusion. Pharynx injected with green PND. Neck supple with mild soft adenopathy. Lungs clear. Heart RRR.   Assessment: Rhinosinusitis  Plan:  Meds ordered this encounter  Medications  . azithromycin (ZITHROMAX Z-PAK) 250 MG tablet    Sig: Take 2 tablets (500 mg) on  Day 1,  followed by 1 tablet (250 mg) once daily on Days 2 through 5.    Dispense:  6 each    Refill:  0    Order Specific Question:  Supervising Provider    Answer:  Merlyn Albert [[id number]]   OTC meds as directed. Call back if worsens or persists. Discussed smoking cessation.

## 2014-06-24 ENCOUNTER — Ambulatory Visit (INDEPENDENT_AMBULATORY_CARE_PROVIDER_SITE_OTHER): Payer: BC Managed Care – PPO | Admitting: Family Medicine

## 2014-06-24 ENCOUNTER — Encounter: Payer: Self-pay | Admitting: Family Medicine

## 2014-06-24 VITALS — BP 112/70 | Temp 98.5°F | Ht 64.0 in | Wt 158.1 lb

## 2014-06-24 DIAGNOSIS — J018 Other acute sinusitis: Secondary | ICD-10-CM

## 2014-06-24 MED ORDER — AZITHROMYCIN 250 MG PO TABS: ORAL_TABLET | ORAL | Status: DC

## 2014-06-24 NOTE — Progress Notes (Signed)
   Subjective:    Patient ID: [name], female    DOB: 03-20-1981, 33 y.o.   MRN: [id number]  URI  This is a new problem. The current episode started today. The problem has been unchanged. There has been no fever. Associated symptoms include congestion, coughing and rhinorrhea. Pertinent negatives include no chest pain, ear pain or wheezing. Associated symptoms comments: Tightness in chest. She has tried nothing for the symptoms. The treatment provided no relief.   Patient states that she has no other concerns at this time.  PMH frequent sinus/bronchitis  Review of Systems  Constitutional: Negative for fever and activity change.  HENT: Positive for congestion and rhinorrhea. Negative for ear pain.   Eyes: Negative for discharge.  Respiratory: Positive for cough. Negative for shortness of breath and wheezing.   Cardiovascular: Negative for chest pain.       Objective:   Physical Exam  Nursing note and vitals reviewed. Constitutional: She appears well-developed.  HENT:  Head: Normocephalic.  Nose: Nose normal.  Mouth/Throat: Oropharynx is clear and moist. No oropharyngeal exudate.  Neck: Neck supple.  Cardiovascular: Normal rate and normal heart sounds.   No murmur heard. Pulmonary/Chest: Effort normal and breath sounds normal. She has no wheezes.  Lymphadenopathy:    She has no cervical adenopathy.  Skin: Skin is warm and dry.          Assessment & Plan:  Probable viral illness secondary possibility of sinusitis and bronchitis. Patient states that when she gets like this typically within a couple days her sinuses are killing her and she has chest congestion and wheezing she states typically if she starts on Zithromax that helps. We will go ahead and start her on antibiotics warning signs were discussed with the patient if she has progressive troubles she needs to followup

## 2014-06-26 ENCOUNTER — Encounter: Payer: Self-pay | Admitting: Family Medicine

## 2016-06-07 ENCOUNTER — Ambulatory Visit (INDEPENDENT_AMBULATORY_CARE_PROVIDER_SITE_OTHER): Payer: BLUE CROSS/BLUE SHIELD | Admitting: Obstetrics & Gynecology

## 2016-06-07 ENCOUNTER — Encounter: Payer: Self-pay | Admitting: Obstetrics & Gynecology

## 2016-06-07 ENCOUNTER — Other Ambulatory Visit (HOSPITAL_COMMUNITY)
Admission: RE | Admit: 2016-06-07 | Discharge: 2016-06-07 | Disposition: A | Discharge status: Home / Self Care | Payer: BLUE CROSS/BLUE SHIELD | Source: Ambulatory Visit | Attending: Obstetrics & Gynecology | Admitting: Obstetrics & Gynecology

## 2016-06-07 VITALS — BP 100/70 | HR 84 | Ht 62.0 in | Wt 152.0 lb

## 2016-06-07 DIAGNOSIS — Z01411 Encounter for gynecological examination (general) (routine) with abnormal findings: Secondary | ICD-10-CM

## 2016-06-07 DIAGNOSIS — Z1151 Encounter for screening for human papillomavirus (HPV): Secondary | ICD-10-CM | POA: Diagnosis not present

## 2016-06-07 DIAGNOSIS — Z01419 Encounter for gynecological examination (general) (routine) without abnormal findings: Secondary | ICD-10-CM

## 2016-06-07 DIAGNOSIS — N920 Excessive and frequent menstruation with regular cycle: Secondary | ICD-10-CM | POA: Diagnosis not present

## 2016-06-07 DIAGNOSIS — N946 Dysmenorrhea, unspecified: Secondary | ICD-10-CM

## 2016-06-07 MED ORDER — MEGESTROL ACETATE 40 MG PO TABS: ORAL_TABLET | ORAL | 3 refills | Status: DC

## 2016-06-07 NOTE — Progress Notes (Signed)
Subjective:     [name] is a 35 y.o. female here for a routine exam.  Patient's last menstrual period was 05/29/2016. No obstetric history on file. Birth Control Method:  BTL Menstrual Calendar(currently): regular heavy soils clothes wear tampons + pads baseball size clots  Bad pain with periods, no pain otherwise, no dyspareunia Current complaints: as above.   Current acute medical issues:  none   Recent Gynecologic History Patient's last menstrual period was 05/29/2016. Last Pap: 2014,  normal Last mammogram: ,    Past Medical History:  Diagnosis Date  . Depression     Past Surgical History:  Procedure Laterality Date  . CESAREAN SECTION    . dental extraction      OB History    No data available      Social History   Social History  . Marital status: Married    Spouse name: N/A  . Number of children: N/A  . Years of education: N/A   Social History Main Topics  . Smoking status: Current Every Day Smoker  . Smokeless tobacco: Current User  . Alcohol use Yes     Comment: Occ  . Drug use: No  . Sexual activity: Yes   Other Topics Concern  . None   Social History Narrative  . None    Family History  Problem Relation Age of Onset  . Diabetes Maternal Grandmother   . Cataracts Maternal Grandmother   . Kidney disease Maternal Grandmother   . Diabetes Mother   . Heart attack Mother      Current Outpatient Prescriptions:  .  megestrol (MEGACE) 40 MG tablet, 3 tablets a day for 5 days, 2 tablets a day for 5 days then 1 tablet daily, Disp: 45 tablet, Rfl: 3  Review of Systems  Review of Systems  Constitutional: Negative for fever, chills, weight loss, malaise/fatigue and diaphoresis.  HENT: Negative for hearing loss, ear pain, nosebleeds, congestion, sore throat, neck pain, tinnitus and ear discharge.   Eyes: Negative for blurred vision, double vision, photophobia, pain, discharge and redness.  Respiratory: Negative for cough, hemoptysis,  sputum production, shortness of breath, wheezing and stridor.   Cardiovascular: Negative for chest pain, palpitations, orthopnea, claudication, leg swelling and PND.  Gastrointestinal: negative for abdominal pain. Negative for heartburn, nausea, vomiting, diarrhea, constipation, blood in stool and melena.  Genitourinary: Negative for dysuria, urgency, frequency, hematuria and flank pain.  Musculoskeletal: Negative for myalgias, back pain, joint pain and falls.  Skin: Negative for itching and rash.  Neurological: Negative for dizziness, tingling, tremors, sensory change, speech change, focal weakness, seizures, loss of consciousness, weakness and headaches.  Endo/Heme/Allergies: Negative for environmental allergies and polydipsia. Does not bruise/bleed easily.  Psychiatric/Behavioral: Negative for depression, suicidal ideas, hallucinations, memory loss and substance abuse. The patient is not nervous/anxious and does not have insomnia.        Objective:  Blood pressure 100/70, pulse 84, height 5\' 2"  (1.575 m), weight 152 lb (68.9 kg), last menstrual period 05/29/2016.   Physical Exam  Vitals reviewed. Constitutional: She is oriented to person, place, and time. She appears well-developed and well-nourished.  HENT:  Head: Normocephalic and atraumatic.        Right Ear: External ear normal.  Left Ear: External ear normal.  Nose: Nose normal.  Mouth/Throat: Oropharynx is clear and moist.  Eyes: Conjunctivae and EOM are normal. Pupils are equal, round, and reactive to light. Right eye exhibits no discharge. Left eye exhibits no discharge. No scleral icterus.  Neck: Normal range of motion. Neck supple. No tracheal deviation present. No thyromegaly present.  Cardiovascular: Normal rate, regular rhythm, normal heart sounds and intact distal pulses.  Exam reveals no gallop and no friction rub.   No murmur heard. Respiratory: Effort normal and breath sounds normal. No respiratory distress. She has no  wheezes. She has no rales. She exhibits no tenderness.  GI: Soft. Bowel sounds are normal. She exhibits no distension and no mass. There is no tenderness. There is no rebound and no guarding.  Genitourinary:  Breasts no masses skin changes or nipple changes bilaterally      Vulva is normal without lesions Vagina is pink moist without discharge Cervix normal in appearance and pap is done Uterus is normal size shape and contour Adnexa is negative with normal sized ovaries   Musculoskeletal: Normal range of motion. She exhibits no edema and no tenderness.  Neurological: She is alert and oriented to person, place, and time. She has normal reflexes. She displays normal reflexes. No cranial nerve deficit. She exhibits normal muscle tone. Coordination normal.  Skin: Skin is warm and dry. No rash noted. No erythema. No pallor.  Psychiatric: She has a normal mood and affect. Her behavior is normal. Judgment and thought content normal.       Medications Ordered at today's visit: Meds ordered this encounter  Medications  . megestrol (MEGACE) 40 MG tablet    Sig: 3 tablets a day for 5 days, 2 tablets a day for 5 days then 1 tablet daily    Dispense:  45 tablet    Refill:  3    Other orders placed at today's visit: Orders Placed This Encounter  Procedures  . US Pelvis Complete  . US Transvaginal Non-OB      Assessment:    Healthy female exam.    Plan:    Follow up in: 1 month.    Megestrol therapy Sonogram 1 month to evaluate for anatomical abnormalities, has a retroverted uterus, then will decied on management of her menorrhagia dysmenorrhea   Return in about 1 month (around 07/08/2016) for GYN sono, Follow up, with Dr Despina HiddenEure.

## 2016-06-09 LAB — CYTOLOGY - PAP

## 2016-07-04 ENCOUNTER — Telehealth: Payer: Self-pay | Admitting: Obstetrics & Gynecology

## 2016-07-04 NOTE — Telephone Encounter (Signed)
Pt states she started period on 06/30/2016, pt states she is now taking the Megace one tablet a day. Does she need to increase the Megace?

## 2016-07-04 NOTE — Telephone Encounter (Signed)
Pt called stating that Dr.Eure has prescribed her medication to stop her period, pt finished the medication and her period has started. Pt would to know if she needs to get on the medication again, or what is next. Please contact pt

## 2016-07-05 NOTE — Telephone Encounter (Signed)
Informed patient of message from Dr Despina HiddenEure to try increasing the Megace to 2 a day to see if that helps stop the bleeding. Pt understood. No further questions.

## 2016-07-05 NOTE — Telephone Encounter (Signed)
Yes try bumping back up to 2 a day to see if this stops it again

## 2016-07-12 ENCOUNTER — Ambulatory Visit (INDEPENDENT_AMBULATORY_CARE_PROVIDER_SITE_OTHER): Payer: BLUE CROSS/BLUE SHIELD | Admitting: Obstetrics & Gynecology

## 2016-07-12 ENCOUNTER — Ambulatory Visit (INDEPENDENT_AMBULATORY_CARE_PROVIDER_SITE_OTHER): Payer: BLUE CROSS/BLUE SHIELD

## 2016-07-12 ENCOUNTER — Encounter: Payer: Self-pay | Admitting: Obstetrics & Gynecology

## 2016-07-12 VITALS — BP 100/70 | HR 72 | Ht 66.0 in | Wt 150.0 lb

## 2016-07-12 DIAGNOSIS — N946 Dysmenorrhea, unspecified: Secondary | ICD-10-CM | POA: Diagnosis not present

## 2016-07-12 DIAGNOSIS — N920 Excessive and frequent menstruation with regular cycle: Secondary | ICD-10-CM

## 2016-07-12 DIAGNOSIS — N854 Malposition of uterus: Secondary | ICD-10-CM

## 2016-07-12 MED ORDER — KETOROLAC TROMETHAMINE 10 MG PO TABS: 10.0000 mg | ORAL_TABLET | Freq: Three times a day (TID) | ORAL | 0 refills | Status: DC | PRN

## 2016-07-12 NOTE — Progress Notes (Signed)
Preoperative History and Physical  [name] is a 35 y.o. G3P3 with 2 Caesarean section. with Patient's last menstrual period was 06/30/2016. admitted for a hysteroscopy uterine curettage endometrial ablation [facility] 07/20/2016.  Birth Control Method:  BTL Menstrual Calendar(currently): regular heavy soils clothes wear tampons + pads baseball size clots  Bad pain with periods, no pain otherwise, no dyspareunia  Sonogram is normal appropriate for endometrial ablation  PMH:    Past Medical History:  Diagnosis Date  . Depression     PSH:     Past Surgical History:  Procedure Laterality Date  . CESAREAN SECTION    . dental extraction      POb/GynH:      OB History    No data available      SH:   Social History  Substance Use Topics  . Smoking status: Current Every Day Smoker  . Smokeless tobacco: Current User  . Alcohol use Yes     Comment: Occ    FH:    Family History  Problem Relation Age of Onset  . Diabetes Maternal Grandmother   . Cataracts Maternal Grandmother   . Kidney disease Maternal Grandmother   . Diabetes Mother   . Heart attack Mother      Allergies:  Allergies  Allergen Reactions  . Levaquin [Levofloxacin In D5w]     Causes headache and nausea  . Penicillins Hives    In throat  . Sulfa Antibiotics Hives    Medications:       Current Outpatient Prescriptions:  .  megestrol (MEGACE) 40 MG tablet, 3 tablets a day for 5 days, 2 tablets a day for 5 days then 1 tablet daily, Disp: 45 tablet, Rfl: 3  Review of Systems:   Review of Systems  Constitutional: Negative for fever, chills, weight loss, malaise/fatigue and diaphoresis.  HENT: Negative for hearing loss, ear pain, nosebleeds, congestion, sore throat, neck pain, tinnitus and ear discharge.   Eyes: Negative for blurred vision, double vision, photophobia, pain, discharge and redness.  Respiratory: Negative for cough, hemoptysis, sputum production, shortness of breath, wheezing and  stridor.   Cardiovascular: Negative for chest pain, palpitations, orthopnea, claudication, leg swelling and PND.  Gastrointestinal: Positive for abdominal pain. Negative for heartburn, nausea, vomiting, diarrhea, constipation, blood in stool and melena.  Genitourinary: Negative for dysuria, urgency, frequency, hematuria and flank pain.  Musculoskeletal: Negative for myalgias, back pain, joint pain and falls.  Skin: Negative for itching and rash.  Neurological: Negative for dizziness, tingling, tremors, sensory change, speech change, focal weakness, seizures, loss of consciousness, weakness and headaches.  Endo/Heme/Allergies: Negative for environmental allergies and polydipsia. Does not bruise/bleed easily.  Psychiatric/Behavioral: Negative for depression, suicidal ideas, hallucinations, memory loss and substance abuse. The patient is not nervous/anxious and does not have insomnia.      PHYSICAL EXAM:  Blood pressure 100/70, pulse 72, height 5\' 6"  (1.676 m), weight 150 lb (68 kg), last menstrual period 06/30/2016.    Vitals reviewed. Constitutional: She is oriented to person, place, and time. She appears well-developed and well-nourished.  HENT:  Head: Normocephalic and atraumatic.  Right Ear: External ear normal.  Left Ear: External ear normal.  Nose: Nose normal.  Mouth/Throat: Oropharynx is clear and moist.  Eyes: Conjunctivae and EOM are normal. Pupils are equal, round, and reactive to light. Right eye exhibits no discharge. Left eye exhibits no discharge. No scleral icterus.  Neck: Normal range of motion. Neck supple. No tracheal deviation present. No thyromegaly present.  Cardiovascular:  Normal rate, regular rhythm, normal heart sounds and intact distal pulses.  Exam reveals no gallop and no friction rub.   No murmur heard. Respiratory: Effort normal and breath sounds normal. No respiratory distress. She has no wheezes. She has no rales. She exhibits no tenderness.  GI: Soft.  Bowel sounds are normal. She exhibits no distension and no mass. There is tenderness. There is no rebound and no guarding.  Genitourinary:       Vulva is normal without lesions Vagina is pink moist without discharge Cervix normal in appearance and pap is normal Uterus is normal size, contour, position, consistency, mobility, non-tender Adnexa is negative with normal sized ovaries by sonogram  Musculoskeletal: Normal range of motion. She exhibits no edema and no tenderness.  Neurological: She is alert and oriented to person, place, and time. She has normal reflexes. She displays normal reflexes. No cranial nerve deficit. She exhibits normal muscle tone. Coordination normal.  Skin: Skin is warm and dry. No rash noted. No erythema. No pallor.  Psychiatric: She has a normal mood and affect. Her behavior is normal. Judgment and thought content normal.    Labs: No results found for this or any previous visit (from the past 336 hour(s)).  EKG: No orders found for this or any previous visit.  Imaging Studies: US Transvaginal Non-ob  Result Date: 07/12/2016 GYNECOLOGIC SONOGRAM [name] is a 35 y.o. LMP 06/30/2016 for a pelvic sonogram for menorrhagia/dysmenorrhea. Uterus                      8.4 x 6.2 x 5.1 cm, homogeneous retroverted uterus,wnl Endometrium          12.5 mm, symmetrical, wnl Right ovary             4.1 x 2.5 x 1.3 cm, wnl Left ovary                3.3 x 2.3 x 2.1 cm, wnl Pelvic pain during ultrasound,no free fluid seen. Technician Comments: PELVIC US TA/TV: Homogeneous retroverted uterus,wnl,normal ov's bilat (mobile),EEC 12.5 mm,no free fluid, pelvic pain during ultrasound [employer] 07/12/2016 10:04 AM Clinical Impression and recommendations: I have reviewed the sonogram results above, combined with the patient's current clinical course, below are my impressions and any appropriate recommendations for management based on the sonographic findings. Normal uterus, homogenous  endometrium Normal ovaries [name] 07/12/2016 10:16 AM   US Pelvis Complete  Result Date: 07/12/2016 GYNECOLOGIC SONOGRAM [name] is a 35 y.o. LMP 06/30/2016 for a pelvic sonogram for menorrhagia/dysmenorrhea. Uterus                      8.4 x 6.2 x 5.1 cm, homogeneous retroverted uterus,wnl Endometrium          12.5 mm, symmetrical, wnl Right ovary             4.1 x 2.5 x 1.3 cm, wnl Left ovary                3.3 x 2.3 x 2.1 cm, wnl Pelvic pain during ultrasound,no free fluid seen. Technician Comments: PELVIC US TA/TV: Homogeneous retroverted uterus,wnl,normal ov's bilat (mobile),EEC 12.5 mm,no free fluid, pelvic pain during ultrasound [employer] 07/12/2016 10:04 AM Clinical Impression and recommendations: I have reviewed the sonogram results above, combined with the patient's current clinical course, below are my impressions and any appropriate recommendations for management based on the sonographic findings. Normal uterus, homogenous  endometrium Normal ovaries Jaleiyah Alas [name] 07/12/2016 10:16 AM    Meds ordered this encounter  Medications  . ketorolac (TORADOL) 10 MG tablet    Sig: Take 1 tablet (10 mg total) by mouth every 8 (eight) hours as needed.    Dispense:  15 tablet    Refill:  0     Assessment: Menorrhagia with regular cycle  Dysmenorrhea    Plan: Hysteroscopy uterine curettage endometrial ablation  Jordon Bourquin [name] 07/12/2016 10:35 AM

## 2016-07-12 NOTE — Progress Notes (Signed)
PELVIC US TA/TV: Homogeneous retroverted uterus,wnl,normal ov's bilat (mobile),EEC 12.5 mm,no free fluid, pelvic pain during ultrasound

## 2016-07-15 NOTE — Patient Instructions (Signed)
[name]  07/15/2016     @PREFPERIOPPHARMACY @   Your procedure is scheduled on  07/20/2016   Report to [facility] at  1030 A.M.  Call this number if you have problems the morning of surgery:  [phone]   Remember:  Do not eat food or drink liquids after midnight.  Take these medicines the morning of surgery with A SIP OF WATER  toradol.   Do not wear jewelry, make-up or nail polish.  Do not wear lotions, powders, or perfumes, or deoderant.  Do not shave 48 hours prior to surgery.  Men may shave face and neck.  Do not bring valuables to the hospital.  [facility] is not responsible for any belongings or valuables.  Contacts, dentures or bridgework may not be worn into surgery.  Leave your suitcase in the car.  After surgery it may be brought to your room.  For patients admitted to the hospital, discharge time will be determined by your treatment team.  Patients discharged the day of surgery will not be allowed to drive home.   Name and phone number of your driver:   family Special instructions:  none  Please read over the following fact sheets that you were given. Anesthesia Post-op Instructions and Care and Recovery After Surgery       Endometrial Ablation Endometrial ablation removes the lining of the uterus (endometrium). It is usually a same-day, outpatient treatment. Ablation helps avoid major surgery, such as surgery to remove the cervix and uterus (hysterectomy). After endometrial ablation, you will have little or no menstrual bleeding and may not be able to have children. However, if you are premenopausal, you will need to use a reliable method of birth control following the procedure because of the small chance that pregnancy can occur. There are different reasons to have this procedure. These reasons include:  Heavy periods.  Bleeding that is causing anemia.  Irregular bleeding.  Bleeding fibroids on the lining inside the  uterus if they are smaller than 3 centimeters. This procedure may not be possible for you if:   You want to have children in the future.   You have severe cramps with your menstrual period.   You have precancerous or cancerous cells in your uterus.   You were recently pregnant.   You have gone through menopause.   You have had major surgery on your uterus, resulting in thinning of the uterine wall. Surgeries may include:  The removal of one or more uterine fibroids (myomectomy).  A cesarean section with a classic (vertical) incision on your uterus. Ask your health care provider what type of cesarean you had. Sometimes the scar on your skin is different than the scar on your uterus. Even if you have had surgery on your uterus, certain types of ablation may still be safe for you. Talk with your health care provider. LET [facility] KNOW ABOUT:  Any allergies you have.  All medicines you are taking, including vitamins, herbs, eye drops, creams, and over-the-counter medicines.  Previous problems you or members of your family have had with the use of anesthetics.  Any blood disorders you have.  Previous surgeries you have had.  Medical conditions you have. RISKS AND COMPLICATIONS  Generally, this is a safe procedure. However, as with any procedure, complications can occur. Possible complications include:  Perforation of the uterus.  Bleeding.  Infection of the uterus, bladder,  or vagina.  Injury to surrounding organs.  An air bubble to the lung (air embolus).  Pregnancy following the procedure.  Failure of the procedure to help the problem, requiring hysterectomy.  Decreased ability to diagnose cancer in the lining of the uterus. BEFORE THE PROCEDURE  The lining of the uterus must be tested to make sure there is no pre-cancerous or cancer cells present.  An ultrasound may be performed to look at the size of the uterus and to check for  abnormalities.  Medicines may be given to thin the lining of the uterus. PROCEDURE  During the procedure, your health care provider will use a tool called a resectoscope to help see inside your uterus. There are different ways to remove the lining of your uterus.   Radiofrequency - This method uses a radiofrequency-alternating electric current to remove the lining of the uterus.  Cryotherapy - This method uses extreme cold to freeze the lining of the uterus.  Heated-Free Liquid - This method uses heated salt (saline) solution to remove the lining of the uterus.  Microwave - This method uses high-energy microwaves to heat up the lining of the uterus to remove it.  Thermal balloon - This method involves inserting a catheter with a balloon tip into the uterus. The balloon tip is filled with heated fluid to remove the lining of the uterus. AFTER THE PROCEDURE  After your procedure, do not have sexual intercourse or insert anything into your vagina until permitted by your health care provider. After the procedure, you may experience:  Cramps.  Vaginal discharge.  Frequent urination.   This information is not intended to replace advice given to you by your health care provider. Make sure you discuss any questions you have with your health care provider.   Document Released: 08/05/2004 Document Revised: 06/17/2015 Document Reviewed: 02/27/2013 Elsevier Interactive Patient Education [employer] Hysteroscopy Hysteroscopy is a procedure used for looking inside the womb (uterus). It may be done for various reasons, including:  To evaluate abnormal bleeding, fibroid (benign, noncancerous) tumors, polyps, scar tissue (adhesions), and possibly cancer of the uterus.  To look for lumps (tumors) and other uterine growths.  To look for causes of why a woman cannot get pregnant (infertility), causes of recurrent loss of pregnancy (miscarriages), or a lost intrauterine device (IUD).  To  perform a sterilization by blocking the fallopian tubes from inside the uterus. In this procedure, a thin, flexible tube with a tiny light and camera on the end of it (hysteroscope) is used to look inside the uterus. A hysteroscopy should be done right after a menstrual period to be sure you are not pregnant. LET [facility] CARE PROVIDER KNOW ABOUT:   Any allergies you have.  All medicines you are taking, including vitamins, herbs, eye drops, creams, and over-the-counter medicines.  Previous problems you or members of your family have had with the use of anesthetics.  Any blood disorders you have.  Previous surgeries you have had.  Medical conditions you have. RISKS AND COMPLICATIONS  Generally, this is a safe procedure. However, as with any procedure, complications can occur. Possible complications include:  Putting a hole in the uterus.  Excessive bleeding.  Infection.  Damage to the cervix.  Injury to other organs.  Allergic reaction to medicines.  Too much fluid used in the uterus for the procedure. BEFORE THE PROCEDURE   Ask your health care provider about changing or stopping any regular medicines.  Do not take aspirin or blood thinners  for 1 week before the procedure, or as directed by your health care provider. These can cause bleeding.  If you smoke, do not smoke for 2 weeks before the procedure.  In some cases, a medicine is placed in the cervix the day before the procedure. This medicine makes the cervix have a larger opening (dilate). This makes it easier for the instrument to be inserted into the uterus during the procedure.  Do not eat or drink anything for at least 8 hours before the surgery.  Arrange for someone to take you home after the procedure. PROCEDURE   You may be given a medicine to relax you (sedative). You may also be given one of the following:  A medicine that numbs the area around the cervix (local anesthetic).  A medicine that makes  you sleep through the procedure (general anesthetic).  The hysteroscope is inserted through the vagina into the uterus. The camera on the hysteroscope sends a picture to a TV screen. This gives the surgeon a good view inside the uterus.  During the procedure, air or a liquid is put into the uterus, which allows the surgeon to see better.  Sometimes, tissue is gently scraped from inside the uterus. These tissue samples are sent to a lab for testing. AFTER THE PROCEDURE   If you had a general anesthetic, you may be groggy for a couple hours after the procedure.  If you had a local anesthetic, you will be able to go home as soon as you are stable and feel ready.  You may have some cramping. This normally lasts for a couple days.  You may have bleeding, which varies from light spotting for a few days to menstrual-like bleeding for 3-7 days. This is normal.  If your test results are not back during the visit, make an appointment with your health care provider to find out the results.   This information is not intended to replace advice given to you by your health care provider. Make sure you discuss any questions you have with your health care provider.   Document Released: 01/02/2001 Document Revised: 07/17/2013 Document Reviewed: 04/25/2013 Elsevier Interactive Patient Education 2016 [employer] Hysteroscopy, Care After Refer to this sheet in the next few weeks. These instructions provide you with information on caring for yourself after your procedure. Your health care provider may also give you more specific instructions. Your treatment has been planned according to current medical practices, but problems sometimes occur. Call your health care provider if you have any problems or questions after your procedure.  WHAT TO EXPECT AFTER THE PROCEDURE After your procedure, it is typical to have the following:  You may have some cramping. This normally lasts for a couple days.  You may have  bleeding. This can vary from light spotting for a few days to menstrual-like bleeding for 3-7 days. HOME CARE INSTRUCTIONS  Rest for the first 1-2 days after the procedure.  Only take over-the-counter or prescription medicines as directed by your health care provider. Do not take aspirin. It can increase the chances of bleeding.  Take showers instead of baths for 2 weeks or as directed by your health care provider.  Do not drive for 24 hours or as directed.  Do not drink alcohol while taking pain medicine.  Do not use tampons, douche, or have sexual intercourse for 2 weeks or until your health care provider says it is okay.  Take your temperature twice a day for 4-5 days. Write it down  each time.  Follow your health care provider's advice about diet, exercise, and lifting.  If you develop constipation, you may:  Take a mild laxative if your health care provider approves.  Add bran foods to your diet.  Drink enough fluids to keep your urine clear or pale yellow.  Try to have someone with you or available to you for the first 24-48 hours, especially if you were given a general anesthetic.  Follow up with your health care provider as directed. SEEK MEDICAL CARE IF:  You feel dizzy or lightheaded.  You feel sick to your stomach (nauseous).  You have abnormal vaginal discharge.  You have a rash.  You have pain that is not controlled with medicine. SEEK IMMEDIATE MEDICAL CARE IF:  You have bleeding that is heavier than a normal menstrual period.  You have a fever.  You have increasing cramps or pain, not controlled with medicine.  You have new belly (abdominal) pain.  You pass out.  You have pain in the tops of your shoulders (shoulder strap areas).  You have shortness of breath.   This information is not intended to replace advice given to you by your health care provider. Make sure you discuss any questions you have with your health care provider.   Document  Released: 07/17/2013 Document Reviewed: 07/17/2013 Elsevier Interactive Patient Education 2016 Else[employer] Inc. Dilation and Curettage or Vacuum Curettage Dilation and curettage (D&C) and vacuum curettage are minor procedures. A D&C involves stretching (dilation) the cervix and scraping (curettage) the inside lining of the womb (uterus). During a D&C, tissue is gently scraped from the inside lining of the uterus. During a vacuum curettage, the lining and tissue in the uterus are removed with the use of gentle suction.  Curettage may be performed to either diagnose or treat a problem. As a diagnostic procedure, curettage is performed to examine tissues from the uterus. A diagnostic curettage may be performed for the following symptoms:   Irregular bleeding in the uterus.   Bleeding with the development of clots.   Spotting between menstrual periods.   Prolonged menstrual periods.   Bleeding after menopause.   No menstrual period (amenorrhea).   A change in size and shape of the uterus.  As a treatment procedure, curettage may be performed for the following reasons:   Removal of an IUD (intrauterine device).   Removal of retained placenta after giving birth. Retained placenta can cause an infection or bleeding severe enough to require transfusions.   Abortion.   Miscarriage.   Removal of polyps inside the uterus.   Removal of uncommon types of noncancerous lumps (fibroids).  LET [facility] CARE PROVIDER KNOW ABOUT:   Any allergies you have.   All medicines you are taking, including vitamins, herbs, eye drops, creams, and over-the-counter medicines.   Previous problems you or members of your family have had with the use of anesthetics.   Any blood disorders you have.   Previous surgeries you have had.   Medical conditions you have. RISKS AND COMPLICATIONS  Generally, this is a safe procedure. However, as with any procedure, complications can occur. Possible  complications include:  Excessive bleeding.   Infection of the uterus.   Damage to the cervix.   Development of scar tissue (adhesions) inside the uterus, later causing abnormal amounts of menstrual bleeding.   Complications from the general anesthetic, if a general anesthetic is used.   Putting a hole (perforation) in the uterus. This is rare.  BEFORE THE PROCEDURE  Eat and drink before the procedure only as directed by your health care provider.   Arrange for someone to take you home.  PROCEDURE  This procedure usually takes about 15-30 minutes.  You will be given one of the following:  A medicine that numbs the area in and around the cervix (local anesthetic).   A medicine to make you sleep through the procedure (general anesthetic).  You will lie on your back with your legs in stirrups.   A warm metal or plastic instrument (speculum) will be placed in your vagina to keep it open and to allow the health care provider to see the cervix.  There are two ways in which your cervix can be softened and dilated. These include:   Taking a medicine.   Having thin rods (laminaria) inserted into your cervix.   A curved tool (curette) will be used to scrape cells from the inside lining of the uterus. In some cases, gentle suction is applied with the curette. The curette will then be removed.  AFTER THE PROCEDURE   You will rest in the recovery area until you are stable and are ready to go home.   You may feel sick to your stomach (nauseous) or throw up (vomit) if you were given a general anesthetic.   You may have a sore throat if a tube was placed in your throat during general anesthesia.   You may have light cramping and bleeding. This may last for 2 days to 2 weeks after the procedure.   Your uterus needs to make a new lining after the procedure. This may make your next period late.   This information is not intended to replace advice given to you by your  health care provider. Make sure you discuss any questions you have with your health care provider.   Document Released: 09/26/2005 Document Revised: 05/29/2013 Document Reviewed: 04/25/2013 Elsevier Interactive Patient Education 2016 [employer]  Dilation and Curettage or Vacuum Curettage, Care After These instructions give you information on caring for yourself after your procedure. Your doctor may also give you more specific instructions. Call your doctor if you have any problems or questions after your procedure. HOME CARE  Do not drive for 24 hours.  Wait 1 week before doing any activities that wear you out.  Take your temperature 2 times a day for 4 days. Write it down. Tell your doctor if you have a fever.  Do not stand for a long time.  Do not lift, push, or pull anything over 10 pounds (4.5 kilograms).  Limit stair climbing to once or twice a day.  Rest often.  Continue with your usual diet.  Drink enough fluids to keep your pee (urine) clear or pale yellow.  If you have a hard time pooping (constipation), you may:  Take a medicine to help you go poop (laxative) as told by your doctor.  Eat more fruit and bran.  Drink more fluids.  Take showers, not baths, for as long as told by your doctor.  Do not swim or use a hot tub until your doctor says it is okay.  Have someone with you for 1-2 days after the procedure.  Do not douche, use tampons, or have sex (intercourse) for 2 weeks.  Only take medicines as told by your doctor. Do not take aspirin. It can cause bleeding.  Keep all doctor visits. GET HELP IF:  You have cramps or pain not helped by medicine.  You have new pain in  the belly (abdomen).  You have a bad smelling fluid coming from your vagina.  You have a rash.  You have problems with any medicine. GET HELP RIGHT AWAY IF:   You start to bleed more than a regular period.  You have a fever.  You have chest pain.  You have trouble  breathing.  You feel dizzy or feel like passing out (fainting).  You pass out.  You have pain in the tops of your shoulders.  You have vaginal bleeding with or without clumps of blood (blood clots). MAKE SURE YOU:  Understand these instructions.  Will watch your condition.  Will get help right away if you are not doing well or get worse.   This information is not intended to replace advice given to you by your health care provider. Make sure you discuss any questions you have with your health care provider.   Document Released: 07/05/2008 Document Revised: 10/01/2013 Document Reviewed: 04/25/2013 Elsevier Interactive Patient Education 2016 [employer] PATIENT INSTRUCTIONS POST-ANESTHESIA  IMMEDIATELY FOLLOWING SURGERY:  Do not drive or operate machinery for the first twenty four hours after surgery.  Do not make any important decisions for twenty four hours after surgery or while taking narcotic pain medications or sedatives.  If you develop intractable nausea and vomiting or a severe headache please notify your doctor immediately.  FOLLOW-UP:  Please make an appointment with your surgeon as instructed. You do not need to follow up with anesthesia unless specifically instructed to do so.  WOUND CARE INSTRUCTIONS (if applicable):  Keep a dry clean dressing on the anesthesia/puncture wound site if there is drainage.  Once the wound has quit draining you may leave it open to air.  Generally you should leave the bandage intact for twenty four hours unless there is drainage.  If the epidural site drains for more than 36-48 hours please call the anesthesia department.  QUESTIONS?:  Please feel free to call your physician or the hospital operator if you have any questions, and they will be happy to assist you.

## 2016-07-18 ENCOUNTER — Encounter (HOSPITAL_COMMUNITY)
Admission: RE | Admit: 2016-07-18 | Discharge: 2016-07-18 | Disposition: A | Discharge status: Home / Self Care | Payer: BLUE CROSS/BLUE SHIELD | Source: Ambulatory Visit | Attending: Obstetrics & Gynecology | Admitting: Obstetrics & Gynecology

## 2016-07-18 ENCOUNTER — Encounter (HOSPITAL_COMMUNITY): Payer: Self-pay

## 2016-07-18 DIAGNOSIS — F329 Major depressive disorder, single episode, unspecified: Secondary | ICD-10-CM | POA: Diagnosis not present

## 2016-07-18 DIAGNOSIS — F17210 Nicotine dependence, cigarettes, uncomplicated: Secondary | ICD-10-CM | POA: Diagnosis not present

## 2016-07-18 DIAGNOSIS — Z881 Allergy status to other antibiotic agents status: Secondary | ICD-10-CM | POA: Diagnosis not present

## 2016-07-18 DIAGNOSIS — Z882 Allergy status to sulfonamides status: Secondary | ICD-10-CM | POA: Diagnosis not present

## 2016-07-18 DIAGNOSIS — N946 Dysmenorrhea, unspecified: Secondary | ICD-10-CM | POA: Diagnosis not present

## 2016-07-18 DIAGNOSIS — N840 Polyp of corpus uteri: Secondary | ICD-10-CM | POA: Diagnosis not present

## 2016-07-18 DIAGNOSIS — N921 Excessive and frequent menstruation with irregular cycle: Secondary | ICD-10-CM | POA: Diagnosis not present

## 2016-07-18 DIAGNOSIS — Z880 Allergy status to penicillin: Secondary | ICD-10-CM | POA: Diagnosis not present

## 2016-07-18 HISTORY — DX: Family history of other specified conditions: Z84.89

## 2016-07-18 HISTORY — DX: Anemia, unspecified: D64.9

## 2016-07-18 LAB — HCG, QUANTITATIVE, PREGNANCY: hCG, Beta Chain, Quant, S: <1 m[IU]/mL (ref <5)

## 2016-07-18 LAB — COMPREHENSIVE METABOLIC PANEL
ALBUMIN: 4 g/dL (ref 3.5–5.0)
ALK PHOS: 60 U/L (ref 38–126)
ALT: 16 U/L (ref 14–54)
AST: 20 U/L (ref 15–41)
Anion gap: 6 (ref 5–15)
BILIRUBIN TOTAL: 0.6 mg/dL (ref 0.3–1.2)
BUN: 11 mg/dL (ref 6–20)
CALCIUM: 9.3 mg/dL (ref 8.9–10.3)
CO2: 22 mmol/L (ref 22–32)
Chloride: 110 mmol/L (ref 101–111)
Creatinine, Ser: 0.94 mg/dL (ref 0.44–1.00)
GFR calc Af Amer: >60 mL/min (ref >60)
GFR calc non Af Amer: >60 mL/min (ref >60)
GLUCOSE: 87 mg/dL (ref 65–99)
Potassium: 4.2 mmol/L (ref 3.5–5.1)
Sodium: 138 mmol/L (ref 135–145)
TOTAL PROTEIN: 7 g/dL (ref 6.5–8.1)

## 2016-07-18 LAB — URINALYSIS, ROUTINE W REFLEX MICROSCOPIC
BILIRUBIN URINE: NEGATIVE
GLUCOSE, UA: NEGATIVE mg/dL
KETONES UR: NEGATIVE mg/dL
Leukocytes, UA: NEGATIVE
Nitrite: NEGATIVE
PH: 6 (ref 5.0–8.0)
Protein, ur: NEGATIVE mg/dL
Specific Gravity, Urine: 1.01 (ref 1.005–1.030)

## 2016-07-18 LAB — CBC
HCT: 39.2 % (ref 36.0–46.0)
Hemoglobin: 12.9 g/dL (ref 12.0–15.0)
MCH: 28.9 pg (ref 26.0–34.0)
MCHC: 32.9 g/dL (ref 30.0–36.0)
MCV: 87.7 fL (ref 78.0–100.0)
PLATELETS: 296 10*3/uL (ref 150–400)
RBC: 4.47 MIL/uL (ref 3.87–5.11)
RDW: 16.1 % — AB (ref 11.5–15.5)
WBC: 7 10*3/uL (ref 4.0–10.5)

## 2016-07-18 LAB — URINE MICROSCOPIC-ADD ON
Bacteria, UA: NONE SEEN
WBC, UA: NONE SEEN WBC/hpf (ref 0–5)

## 2016-07-18 NOTE — Pre-Procedure Instructions (Signed)
Patient given information to sign up for my chart at home. 

## 2016-07-20 ENCOUNTER — Ambulatory Visit (HOSPITAL_COMMUNITY)
Admission: RE | Admit: 2016-07-20 | Discharge: 2016-07-20 | Disposition: A | Discharge status: Home / Self Care | Payer: BLUE CROSS/BLUE SHIELD | Source: Ambulatory Visit | Attending: Obstetrics & Gynecology | Admitting: Obstetrics & Gynecology

## 2016-07-20 ENCOUNTER — Encounter (HOSPITAL_COMMUNITY): Payer: Self-pay | Admitting: *Deleted

## 2016-07-20 ENCOUNTER — Ambulatory Visit (HOSPITAL_COMMUNITY): Payer: BLUE CROSS/BLUE SHIELD | Admitting: Anesthesiology

## 2016-07-20 ENCOUNTER — Encounter (HOSPITAL_COMMUNITY)
Admission: RE | Disposition: A | Discharge status: Home / Self Care | Payer: Self-pay | Source: Ambulatory Visit | Attending: Obstetrics & Gynecology

## 2016-07-20 DIAGNOSIS — N921 Excessive and frequent menstruation with irregular cycle: Secondary | ICD-10-CM | POA: Insufficient documentation

## 2016-07-20 DIAGNOSIS — F329 Major depressive disorder, single episode, unspecified: Secondary | ICD-10-CM | POA: Insufficient documentation

## 2016-07-20 DIAGNOSIS — Z882 Allergy status to sulfonamides status: Secondary | ICD-10-CM | POA: Insufficient documentation

## 2016-07-20 DIAGNOSIS — F17210 Nicotine dependence, cigarettes, uncomplicated: Secondary | ICD-10-CM | POA: Diagnosis not present

## 2016-07-20 DIAGNOSIS — N840 Polyp of corpus uteri: Secondary | ICD-10-CM | POA: Insufficient documentation

## 2016-07-20 DIAGNOSIS — N920 Excessive and frequent menstruation with regular cycle: Secondary | ICD-10-CM | POA: Diagnosis not present

## 2016-07-20 DIAGNOSIS — N946 Dysmenorrhea, unspecified: Secondary | ICD-10-CM | POA: Insufficient documentation

## 2016-07-20 DIAGNOSIS — Z880 Allergy status to penicillin: Secondary | ICD-10-CM | POA: Insufficient documentation

## 2016-07-20 DIAGNOSIS — Z881 Allergy status to other antibiotic agents status: Secondary | ICD-10-CM | POA: Insufficient documentation

## 2016-07-20 HISTORY — PX: DILITATION & CURRETTAGE/HYSTROSCOPY WITH NOVASURE ABLATION: SHX5568

## 2016-07-20 SURGERY — DILATATION & CURETTAGE/HYSTEROSCOPY WITH NOVASURE ABLATION
Anesthesia: General

## 2016-07-20 MED ORDER — FENTANYL CITRATE (PF) 100 MCG/2ML IJ SOLN
INTRAMUSCULAR | Status: AC
  Filled 2016-07-20: qty 2

## 2016-07-20 MED ORDER — ONDANSETRON HCL 4 MG/2ML IJ SOLN
4.0000 mg | Freq: Once | INTRAMUSCULAR | Status: AC
  Administered 2016-07-20: 4 mg via INTRAVENOUS

## 2016-07-20 MED ORDER — ONDANSETRON HCL 4 MG/2ML IJ SOLN
INTRAMUSCULAR | Status: AC
  Filled 2016-07-20: qty 2

## 2016-07-20 MED ORDER — GLYCOPYRROLATE 0.2 MG/ML IJ SOLN
INTRAMUSCULAR | Status: AC
  Filled 2016-07-20: qty 1

## 2016-07-20 MED ORDER — LACTATED RINGERS IV SOLN
INTRAVENOUS | Status: DC
  Administered 2016-07-20: 12:00:00 via INTRAVENOUS

## 2016-07-20 MED ORDER — KETOROLAC TROMETHAMINE 10 MG PO TABS: 10.0000 mg | ORAL_TABLET | Freq: Three times a day (TID) | ORAL | 0 refills | Status: DC | PRN

## 2016-07-20 MED ORDER — DEXAMETHASONE SODIUM PHOSPHATE 4 MG/ML IJ SOLN
INTRAMUSCULAR | Status: AC
  Filled 2016-07-20: qty 1

## 2016-07-20 MED ORDER — MIDAZOLAM HCL 2 MG/2ML IJ SOLN
1.0000 mg | INTRAMUSCULAR | Status: DC | PRN
  Administered 2016-07-20: 2 mg via INTRAVENOUS

## 2016-07-20 MED ORDER — GLYCOPYRROLATE 0.2 MG/ML IJ SOLN
0.2000 mg | Freq: Once | INTRAMUSCULAR | Status: AC | PRN
  Administered 2016-07-20: 0.2 mg via INTRAVENOUS

## 2016-07-20 MED ORDER — SODIUM CHLORIDE 0.9 % IR SOLN
Status: DC | PRN
  Administered 2016-07-20: 1000 mL

## 2016-07-20 MED ORDER — PROPOFOL 10 MG/ML IV BOLUS
INTRAVENOUS | Status: AC
Start: 2016-07-20 — End: 2016-07-20
  Filled 2016-07-20: qty 20

## 2016-07-20 MED ORDER — DEXAMETHASONE SODIUM PHOSPHATE 4 MG/ML IJ SOLN
4.0000 mg | INTRAMUSCULAR | Status: AC
  Administered 2016-07-20: 4 mg via INTRAVENOUS

## 2016-07-20 MED ORDER — PROPOFOL 10 MG/ML IV BOLUS
INTRAVENOUS | Status: DC | PRN
  Administered 2016-07-20: 150 mg via INTRAVENOUS

## 2016-07-20 MED ORDER — CEFAZOLIN SODIUM-DEXTROSE 2-4 GM/100ML-% IV SOLN
2.0000 g | INTRAVENOUS | Status: AC
  Administered 2016-07-20: 2 g via INTRAVENOUS
  Filled 2016-07-20: qty 100

## 2016-07-20 MED ORDER — FENTANYL CITRATE (PF) 100 MCG/2ML IJ SOLN
INTRAMUSCULAR | Status: DC | PRN
  Administered 2016-07-20 (×2): 50 ug via INTRAVENOUS

## 2016-07-20 MED ORDER — HYDROCODONE-ACETAMINOPHEN 5-325 MG PO TABS: 1.0000 | ORAL_TABLET | Freq: Four times a day (QID) | ORAL | 0 refills | Status: DC | PRN

## 2016-07-20 MED ORDER — FENTANYL CITRATE (PF) 100 MCG/2ML IJ SOLN
25.0000 ug | INTRAMUSCULAR | Status: AC | PRN
  Administered 2016-07-20 (×2): 25 ug via INTRAVENOUS

## 2016-07-20 MED ORDER — LIDOCAINE HCL (CARDIAC) 20 MG/ML IV SOLN
INTRAVENOUS | Status: DC | PRN
  Administered 2016-07-20: 40 mg via INTRAVENOUS

## 2016-07-20 MED ORDER — KETOROLAC TROMETHAMINE 30 MG/ML IJ SOLN
30.0000 mg | Freq: Once | INTRAMUSCULAR | Status: AC
Start: 2016-07-20 — End: 2016-07-20
  Administered 2016-07-20: 30 mg via INTRAVENOUS
  Filled 2016-07-20: qty 1

## 2016-07-20 MED ORDER — LIDOCAINE HCL (PF) 1 % IJ SOLN
INTRAMUSCULAR | Status: AC
  Filled 2016-07-20: qty 5

## 2016-07-20 MED ORDER — MIDAZOLAM HCL 2 MG/2ML IJ SOLN
INTRAMUSCULAR | Status: AC
  Filled 2016-07-20: qty 2

## 2016-07-20 MED ORDER — ONDANSETRON HCL 8 MG PO TABS: 8.0000 mg | ORAL_TABLET | Freq: Three times a day (TID) | ORAL | 0 refills | Status: DC | PRN

## 2016-07-20 MED ORDER — HYDROMORPHONE HCL 1 MG/ML IJ SOLN
0.2500 mg | INTRAMUSCULAR | Status: DC | PRN
  Administered 2016-07-20: 0.5 mg via INTRAVENOUS
  Filled 2016-07-20: qty 1

## 2016-07-20 SURGICAL SUPPLY — 27 items
ABLATOR ENDOMETRIAL BIPOLAR (ABLATOR) ×3 IMPLANT
BAG HAMPER (MISCELLANEOUS) ×3 IMPLANT
CLOTH BEACON ORANGE TIMEOUT ST (SAFETY) ×3 IMPLANT
COVER LIGHT HANDLE STERIS (MISCELLANEOUS) ×6 IMPLANT
FORMALIN 10 PREFIL 120ML (MISCELLANEOUS) ×3 IMPLANT
GAUZE SPONGE 4X4 16PLY XRAY LF (GAUZE/BANDAGES/DRESSINGS) ×3 IMPLANT
GLOVE BIOGEL PI IND STRL 7.0 (GLOVE) ×2 IMPLANT
GLOVE BIOGEL PI IND STRL 8 (GLOVE) ×1 IMPLANT
GLOVE BIOGEL PI INDICATOR 7.0 (GLOVE) ×4
GLOVE BIOGEL PI INDICATOR 8 (GLOVE) ×2
GLOVE ECLIPSE 8.0 STRL XLNG CF (GLOVE) ×3 IMPLANT
GOWN STRL REUS W/TWL LRG LVL3 (GOWN DISPOSABLE) ×3 IMPLANT
GOWN STRL REUS W/TWL XL LVL3 (GOWN DISPOSABLE) ×3 IMPLANT
INST SET HYSTEROSCOPY (KITS) ×3 IMPLANT
IV NS 1000ML (IV SOLUTION) ×2
IV NS 1000ML BAXH (IV SOLUTION) ×1 IMPLANT
KIT ROOM TURNOVER AP CYSTO (KITS) ×3 IMPLANT
MANIFOLD NEPTUNE II (INSTRUMENTS) ×3 IMPLANT
NS IRRIG 1000ML POUR BTL (IV SOLUTION) ×3 IMPLANT
PACK BASIC III (CUSTOM PROCEDURE TRAY) ×2
PACK SRG BSC III STRL LF ECLPS (CUSTOM PROCEDURE TRAY) ×1 IMPLANT
PAD ARMBOARD 7.5X6 YLW CONV (MISCELLANEOUS) ×3 IMPLANT
PAD TELFA 3X4 1S STER (GAUZE/BANDAGES/DRESSINGS) ×3 IMPLANT
SET BASIN LINEN APH (SET/KITS/TRAYS/PACK) ×3 IMPLANT
SET IRRIG Y TYPE TUR BLADDER L (SET/KITS/TRAYS/PACK) ×3 IMPLANT
SHEET LAVH (DRAPES) ×3 IMPLANT
YANKAUER SUCT BULB TIP 10FT TU (MISCELLANEOUS) ×3 IMPLANT

## 2016-07-20 NOTE — Anesthesia Procedure Notes (Signed)
Procedure Name: LMA Insertion Date/Time: 07/20/2016 1:47 PM Performed by: Pernell DupreADAMS, AMY A Pre-anesthesia Checklist: Patient identified, Timeout performed, Emergency Drugs available, Suction available and Patient being monitored Patient Re-evaluated:Patient Re-evaluated prior to inductionOxygen Delivery Method: Circle system utilized Preoxygenation: Pre-oxygenation with 100% oxygen Intubation Type: IV induction Ventilation: Mask ventilation without difficulty LMA: LMA inserted LMA Size: 4.0 Number of attempts: 1 Placement Confirmation: positive ETCO2 and breath sounds checked- equal and bilateral Tube secured with: Tape Dental Injury: Teeth and Oropharynx as per pre-operative assessment

## 2016-07-20 NOTE — H&P (Signed)
Preoperative History and Physical  [name] is a 35 y.o. G3P3 with 2 Caesarean section. with Patient's last menstrual period was 06/30/2016. admitted for a hysteroscopy uterine curettage endometrial ablation NovaSure 07/20/2016.  Birth Control Method: BTL Menstrual Calendar(currently): regular heavy soils clothes wear tampons + pads baseball size clots  Bad pain with periods, no pain otherwise, no dyspareunia  Sonogram is normal appropriate for endometrial ablation  PMH:        Past Medical History:  Diagnosis Date  . Depression     PSH:          Past Surgical History:  Procedure Laterality Date  . CESAREAN SECTION    . dental extraction      POb/GynH:         OB History    No data available      SH:          Social History   Substance Use Topics   . Smoking status: Current Every Day Smoker   . Smokeless tobacco: Current User   . Alcohol use Yes     Comment: Occ     FH:         Family History  Problem Relation Age of Onset  . Diabetes Maternal Grandmother   . Cataracts Maternal Grandmother   . Kidney disease Maternal Grandmother   . Diabetes Mother   . Heart attack Mother      Allergies:       Allergies  Allergen Reactions  . Levaquin [Levofloxacin In D5w]     Causes headache and nausea  . Penicillins Hives    In throat  . Sulfa Antibiotics Hives    Medications:       Current Outpatient Prescriptions:  .  megestrol (MEGACE) 40 MG tablet, 3 tablets a day for 5 days, 2 tablets a day for 5 days then 1 tablet daily, Disp: 45 tablet, Rfl: 3  Review of Systems:   Review of Systems  Constitutional: Negative for fever, chills, weight loss, malaise/fatigue and diaphoresis.  HENT: Negative for hearing loss, ear pain, nosebleeds, congestion, sore throat, neck pain, tinnitus and ear discharge.   Eyes: Negative for blurred vision, double vision, photophobia, pain, discharge and redness.  Respiratory:  Negative for cough, hemoptysis, sputum production, shortness of breath, wheezing and stridor.   Cardiovascular: Negative for chest pain, palpitations, orthopnea, claudication, leg swelling and PND.  Gastrointestinal: Positive for abdominal pain. Negative for heartburn, nausea, vomiting, diarrhea, constipation, blood in stool and melena.  Genitourinary: Negative for dysuria, urgency, frequency, hematuria and flank pain.  Musculoskeletal: Negative for myalgias, back pain, joint pain and falls.  Skin: Negative for itching and rash.  Neurological: Negative for dizziness, tingling, tremors, sensory change, speech change, focal weakness, seizures, loss of consciousness, weakness and headaches.  Endo/Heme/Allergies: Negative for environmental allergies and polydipsia. Does not bruise/bleed easily.  Psychiatric/Behavioral: Negative for depression, suicidal ideas, hallucinations, memory loss and substance abuse. The patient is not nervous/anxious and does not have insomnia.      PHYSICAL EXAM:  Blood pressure 100/70, pulse 72, height 5\' 6"  (1.676 m), weight 150 lb (68 kg), last menstrual period 06/30/2016.    Vitals reviewed. Constitutional: She is oriented to person, place, and time. She appears well-developed and well-nourished.  HENT:  Head: Normocephalic and atraumatic.  Right Ear: External ear normal.  Left Ear: External ear normal.  Nose: Nose normal.  Mouth/Throat: Oropharynx is clear and moist.  Eyes: Conjunctivae and EOM are normal. Pupils are equal, round, and  reactive to light. Right eye exhibits no discharge. Left eye exhibits no discharge. No scleral icterus.  Neck: Normal range of motion. Neck supple. No tracheal deviation present. No thyromegaly present.  Cardiovascular: Normal rate, regular rhythm, normal heart sounds and intact distal pulses.  Exam reveals no gallop and no friction rub.   No murmur heard. Respiratory: Effort normal and breath sounds normal. No respiratory  distress. She has no wheezes. She has no rales. She exhibits no tenderness.  GI: Soft. Bowel sounds are normal. She exhibits no distension and no mass. There is tenderness. There is no rebound and no guarding.  Genitourinary:       Vulva is normal without lesions Vagina is pink moist without discharge Cervix normal in appearance and pap is normal Uterus is normal size, contour, position, consistency, mobility, non-tender Adnexa is negative with normal sized ovaries by sonogram  Musculoskeletal: Normal range of motion. She exhibits no edema and no tenderness.  Neurological: She is alert and oriented to person, place, and time. She has normal reflexes. She displays normal reflexes. No cranial nerve deficit. She exhibits normal muscle tone. Coordination normal.  Skin: Skin is warm and dry. No rash noted. No erythema. No pallor.  Psychiatric: She has a normal mood and affect. Her behavior is normal. Judgment and thought content normal.    Labs: No results found for this or any previous visit (from the past 336 hour(s)).  EKG: No orders found for this or any previous visit.  Imaging Studies:  ImagingResults  US Transvaginal Non-ob  Result Date: 07/12/2016 GYNECOLOGIC SONOGRAM [name] is a 35 y.o. LMP 06/30/2016 for a pelvic sonogram for menorrhagia/dysmenorrhea. Uterus                      8.4 x 6.2 x 5.1 cm, homogeneous retroverted uterus,wnl Endometrium          12.5 mm, symmetrical, wnl Right ovary             4.1 x 2.5 x 1.3 cm, wnl Left ovary                3.3 x 2.3 x 2.1 cm, wnl Pelvic pain during ultrasound,no free fluid seen. Technician Comments: PELVIC US TA/TV: Homogeneous retroverted uterus,wnl,normal ov's bilat (mobile),EEC 12.5 mm,no free fluid, pelvic pain during ultrasound [employer] 07/12/2016 10:04 AM Clinical Impression and recommendations: I have reviewed the sonogram results above, combined with the patient's current clinical course, below are my impressions  and any appropriate recommendations for management based on the sonographic findings. Normal uterus, homogenous endometrium Normal ovaries EURE,LUTHER H 07/12/2016 10:16 AM   US Pelvis Complete  Result Date: 07/12/2016 GYNECOLOGIC SONOGRAM [name] is a 36 y.o. LMP 06/30/2016 for a pelvic sonogram for menorrhagia/dysmenorrhea. Uterus                      8.4 x 6.2 x 5.1 cm, homogeneous retroverted uterus,wnl Endometrium          12.5 mm, symmetrical, wnl Right ovary             4.1 x 2.5 x 1.3 cm, wnl Left ovary                3.3 x 2.3 x 2.1 cm, wnl Pelvic pain during ultrasound,no free fluid seen. Technician Comments: PELVIC US TA/TV: Homogeneous retroverted uterus,wnl,normal ov's bilat (mobile),EEC 12.5 mm,no free fluid, pelvic pain during ultrasound [employer] 07/12/2016 10:04 AM  Clinical Impression and recommendations: I have reviewed the sonogram results above, combined with the patient's current clinical course, below are my impressions and any appropriate recommendations for management based on the sonographic findings. Normal uterus, homogenous endometrium Normal ovaries EURE,LUTHER H 07/12/2016 10:16 AM         Meds ordered this encounter  Medications  . ketorolac (TORADOL) 10 MG tablet    Sig: Take 1 tablet (10 mg total) by mouth every 8 (eight) hours as needed.    Dispense:  15 tablet    Refill:  0     Assessment: Menorrhagia with regular cycle  Dysmenorrhea    Plan: Hysteroscopy uterine curettage endometrial ablation  EURE,LUTHER H 07/12/2016 10:35 AM

## 2016-07-20 NOTE — Discharge Instructions (Signed)
Endometrial Ablation °Endometrial ablation removes the lining of the uterus (endometrium). It is usually a same-day, outpatient treatment. Ablation helps avoid major surgery, such as surgery to remove the cervix and uterus (hysterectomy). After endometrial ablation, you will have little or no menstrual bleeding and may not be able to have children. However, if you are premenopausal, you will need to use a reliable method of birth control following the procedure because of the small chance that pregnancy can occur. °There are different reasons to have this procedure. These reasons include: °· Heavy periods. °· Bleeding that is causing anemia. °· Irregular bleeding. °· Bleeding fibroids on the lining inside the uterus if they are smaller than 3 centimeters. °This procedure may not be possible for you if:  °· You want to have children in the future.   °· You have severe cramps with your menstrual period.   °· You have precancerous or cancerous cells in your uterus.   °· You were recently pregnant.   °· You have gone through menopause.   °· You have had major surgery on your uterus, resulting in thinning of the uterine wall. Surgeries may include: °¨ The removal of one or more uterine fibroids (myomectomy). °¨ A cesarean section with a classic (vertical) incision on your uterus. Ask your health care provider what type of cesarean you had. Sometimes the scar on your skin is different than the scar on your uterus. °Even if you have had surgery on your uterus, certain types of ablation may still be safe for you. Talk with your health care provider. °LET YOUR HEALTH CARE PROVIDER KNOW ABOUT: °· Any allergies you have. °· All medicines you are taking, including vitamins, herbs, eye drops, creams, and over-the-counter medicines. °· Previous problems you or members of your family have had with the use of anesthetics. °· Any blood disorders you have. °· Previous surgeries you have had. °· Medical conditions you have. °RISKS AND  COMPLICATIONS  °Generally, this is a safe procedure. However, as with any procedure, complications can occur. Possible complications include: °· Perforation of the uterus. °· Bleeding. °· Infection of the uterus, bladder, or vagina. °· Injury to surrounding organs. °· An air bubble to the lung (air embolus). °· Pregnancy following the procedure. °· Failure of the procedure to help the problem, requiring hysterectomy. °· Decreased ability to diagnose cancer in the lining of the uterus. °BEFORE THE PROCEDURE °· The lining of the uterus must be tested to make sure there is no pre-cancerous or cancer cells present. °· An ultrasound may be performed to look at the size of the uterus and to check for abnormalities. °· Medicines may be given to thin the lining of the uterus. °PROCEDURE  °During the procedure, your health care provider will use a tool called a resectoscope to help see inside your uterus. There are different ways to remove the lining of your uterus.  °· Radiofrequency - This method uses a radiofrequency-alternating electric current to remove the lining of the uterus. °· Cryotherapy - This method uses extreme cold to freeze the lining of the uterus. °· Heated-Free Liquid - This method uses heated salt (saline) solution to remove the lining of the uterus. °· Microwave - This method uses high-energy microwaves to heat up the lining of the uterus to remove it. °· Thermal balloon - This method involves inserting a catheter with a balloon tip into the uterus. The balloon tip is filled with heated fluid to remove the lining of the uterus. °AFTER THE PROCEDURE  °After your procedure, do   not have sexual intercourse or insert anything into your vagina until permitted by your health care provider. After the procedure, you may experience: °· Cramps. °· Vaginal discharge. °· Frequent urination. °  °This information is not intended to replace advice given to you by your health care provider. Make sure you discuss any  questions you have with your health care provider. °  °Document Released: 08/05/2004 Document Revised: 06/17/2015 Document Reviewed: 02/27/2013 °Elsevier Interactive Patient Education ©2016 [employer] ° °

## 2016-07-20 NOTE — Anesthesia Preprocedure Evaluation (Signed)
Anesthesia Evaluation  Patient identified by MRN, date of birth, ID band Patient awake    Reviewed: Allergy & Precautions, NPO status , Patient's Chart, lab work & pertinent test results  History of Anesthesia Complications (+) PONV and Family history of anesthesia reaction  Airway Mallampati: II  TM Distance: >3 FB Neck ROM: Full    Dental  (+) Edentulous Upper, Edentulous Lower   Pulmonary Current Smoker,    breath sounds clear to auscultation       Cardiovascular negative cardio ROS   Rhythm:Regular Rate:Normal     Neuro/Psych    GI/Hepatic negative GI ROS,   Endo/Other    Renal/GU      Musculoskeletal   Abdominal   Peds  Hematology   Anesthesia Other Findings   Reproductive/Obstetrics                             Anesthesia Physical Anesthesia Plan  ASA: II  Anesthesia Plan: General   Post-op Pain Management:    Induction: Intravenous  Airway Management Planned: LMA  Additional Equipment:   Intra-op Plan:   Post-operative Plan: Extubation in OR  Informed Consent: I have reviewed the patients History and Physical, chart, labs and discussed the procedure including the risks, benefits and alternatives for the proposed anesthesia with the patient or authorized representative who has indicated his/her understanding and acceptance.     Plan Discussed with:   Anesthesia Plan Comments:         Anesthesia Quick Evaluation

## 2016-07-20 NOTE — Interval H&P Note (Signed)
History and Physical Interval Note:  07/20/2016 12:39 PM  [name]  has presented today for surgery, with the diagnosis of menorrhagia  The various methods of treatment have been discussed with the patient and family. After consideration of risks, benefits and other options for treatment, the patient has consented to  Procedure(s): DILATATION & CURETTAGE/HYSTEROSCOPY WITH NOVASURE ABLATION (N/A) as a surgical intervention .  The patient's history has been reviewed, patient examined, no change in status, stable for surgery.  I have reviewed the patient's chart and labs.  Questions were answered to the patient's satisfaction.     Cathlyn Tersigni H

## 2016-07-20 NOTE — Op Note (Signed)
Preoperative diagnosis: Menometrorrhagia                                        Dysmenorrhea                                          Postoperative diagnoses: Same as above   Procedure: Hysteroscopy, uterine curettage, endometrial ablation using Novasure  Surgeon: Lazaro ArmsEURE,Gentri Guardado H   Anesthesia: Laryngeal mask airway  Findings: The endometrium was normal.  There were no fibroid or other abnormalities.  Description of operation: The patient was taken to the operating room and placed in the supine position. She underwent general anesthesia using the laryngeal mask airway. She was placed in the dorsal lithotomy position and prepped and draped in the usual sterile fashion. A Graves speculum was placed and the anterior cervical lip was grasped with a single-tooth tenaculum. The cervix was dilated serially to allow passage of the hysteroscope. Diagnostic hysteroscopy was performed and was found to be normal. A vigorous uterine curettage was then performed and all tissue sent to pathology for evaluation.  I then proceeded to perform the Novasure endometrial ablation.  The cervical length was 3.0. The uterus sounded to  9.5 cm yielding a net length of 6.5 cm.  The endometrial cavity was 4.5 cm wide. The power was 161 watts.  The total time of therapy was 1 min 0 seconds. The array was evaluated after the procedure and tissue was adherent on all the dimensions of the surface, confirming fundal treatment as well.    All of the equipment worked well throughout the procedure.  The patient was awakened from anesthesia and taken to the recovery room in good stable condition all counts were correct. She received 2 g of Ancef and 30 mg of Toradol preoperatively. She will be discharged from the recovery room and followed up in the office in 1- 2 weeks.  [name] H 07/20/2016 2:00 PM

## 2016-07-20 NOTE — Anesthesia Postprocedure Evaluation (Signed)
Anesthesia Post Note Late Entry 1425  Patient: [name]  Procedure(s) Performed: Procedure(s) (LRB): DILATATION & CURETTAGE/HYSTEROSCOPY WITH NOVASURE ENDOMETRIAL ABLATION (N/A)  Patient location during evaluation: PACU Anesthesia Type: General Level of consciousness: awake and alert and oriented Pain management: pain level controlled Vital Signs Assessment: post-procedure vital signs reviewed and stable Respiratory status: spontaneous breathing Cardiovascular status: stable Postop Assessment: no signs of nausea or vomiting Anesthetic complications: no    Last Vitals:  Vitals:   07/20/16 1445 07/20/16 1505  BP: 118/81 124/80  Pulse: 67 75  Resp: 17 16  Temp:  36.4 C    Last Pain:  Vitals:   07/20/16 1505  TempSrc: Oral  PainSc: 2                  ADAMS, AMY A

## 2016-07-20 NOTE — Transfer of Care (Signed)
Immediate Anesthesia Transfer of Care Note  Patient: [name]  Procedure(s) Performed: Procedure(s): DILATATION & CURETTAGE/HYSTEROSCOPY WITH NOVASURE ENDOMETRIAL ABLATION (N/A)  Patient Location: PACU  Anesthesia Type:General  Level of Consciousness: awake, oriented and patient cooperative  Airway & Oxygen Therapy: Patient Spontanous Breathing and Patient connected to face mask oxygen  Post-op Assessment: Report given to RN and Post -op Vital signs reviewed and stable  Post vital signs: Reviewed and stable  Last Vitals:  Vitals:   07/20/16 1200 07/20/16 1300  BP: 100/66 109/74  Pulse:    Resp: (!) 21 14  Temp:      Last Pain:  Vitals:   07/20/16 1112  TempSrc: Oral  PainSc: 5       Patients Stated Pain Goal: 9 (07/20/16 1112)  Complications: No apparent anesthesia complications

## 2016-07-22 ENCOUNTER — Encounter (HOSPITAL_COMMUNITY): Payer: Self-pay | Admitting: Obstetrics & Gynecology

## 2016-07-28 ENCOUNTER — Encounter: Payer: Self-pay | Admitting: Obstetrics & Gynecology

## 2016-07-28 ENCOUNTER — Ambulatory Visit (INDEPENDENT_AMBULATORY_CARE_PROVIDER_SITE_OTHER): Payer: BLUE CROSS/BLUE SHIELD | Admitting: Obstetrics & Gynecology

## 2016-07-28 VITALS — BP 110/70 | HR 64 | Ht 66.0 in | Wt 149.6 lb

## 2016-07-28 DIAGNOSIS — Z9889 Other specified postprocedural states: Secondary | ICD-10-CM

## 2016-07-28 NOTE — Progress Notes (Signed)
  HPI: Patient returns for routine postoperative follow-up having undergone hysteroscopy uterine curettage NovaSure endometrial ablation on 07/20/2016.  The patient's immediate postoperative recovery has been unremarkable. Since hospital discharge the patient reports watery discharge.   Current Outpatient Prescriptions: megestrol (MEGACE) 40 MG tablet, Take 80 mg by mouth daily. , Disp: , Rfl: 2  HYDROcodone-acetaminophen (NORCO/VICODIN) 5-325 MG tablet, Take 1 tablet by mouth every 6 (six) hours as needed. (Patient not taking: Reported on 07/28/2016), Disp: 30 tablet, Rfl: 0 ketorolac (TORADOL) 10 MG tablet, Take 1 tablet (10 mg total) by mouth every 8 (eight) hours as needed. (Patient not taking: Reported on 07/28/2016), Disp: 15 tablet, Rfl: 0 ketorolac (TORADOL) 10 MG tablet, Take 1 tablet (10 mg total) by mouth every 8 (eight) hours as needed. (Patient not taking: Reported on 07/28/2016), Disp: 15 tablet, Rfl: 0 ondansetron (ZOFRAN) 8 MG tablet, Take 1 tablet (8 mg total) by mouth every 8 (eight) hours as needed for nausea. (Patient not taking: Reported on 07/28/2016), Disp: 12 tablet, Rfl: 0  No current facility-administered medications for this visit.     Blood pressure 110/70, pulse 64, height 5\' 6"  (1.676 m), weight 149 lb 9.6 oz (67.9 kg), last menstrual period 06/30/2016.  Physical Exam: Normal post ablation exam  Diagnostic Tests:   Pathology: Benign polyp  Impression: S/p endometrial ablation  Plan:   Follow up: 1  years  Lazaro ArmsEURE,LUTHER H, MD

## 2016-10-31 ENCOUNTER — Encounter: Payer: Self-pay | Admitting: Family Medicine

## 2016-10-31 ENCOUNTER — Ambulatory Visit (INDEPENDENT_AMBULATORY_CARE_PROVIDER_SITE_OTHER): Payer: BLUE CROSS/BLUE SHIELD | Admitting: Family Medicine

## 2016-10-31 VITALS — BP 110/80 | Temp 98.3°F | Ht 64.0 in | Wt 154.5 lb

## 2016-10-31 DIAGNOSIS — J329 Chronic sinusitis, unspecified: Secondary | ICD-10-CM

## 2016-10-31 MED ORDER — CLARITHROMYCIN 500 MG PO TABS: 500.0000 mg | ORAL_TABLET | Freq: Two times a day (BID) | ORAL | 0 refills | Status: DC

## 2016-10-31 NOTE — Progress Notes (Signed)
   Subjective:    Patient ID: [name], female    DOB: 06/24/1981, 36 y.o.   MRN: [id number]  Sinusitis  This is a new problem. The current episode started in the past 7 days. The problem is unchanged. There has been no fever. The pain is moderate. Associated symptoms include coughing, headaches and a hoarse voice. Treatments tried: Nyquil. The treatment provided no relief.   Patient has no other concerns at this time.   Thursday WOKE UP WITH CHEST TIGHT  NO FEVER   SONME DRANAGE   SORE FROM COUGHING   NO FEVER  NON POROD   NO FEVER   RARE USE OF INHALER   FRONTAL SINUX  AREA  OO  Review of Systems  HENT: Positive for hoarse voice.   Respiratory: Positive for cough.   Neurological: Positive for headaches.       Objective:   Physical Exam Alert moderate malaise. Hydration good HEENT frontal tenderness and congestion pharynx normal lungs clear heart regular rhythm voice hoarse       Assessment & Plan:  Impression rhinosinusitis/probable element of bronchitis plan patient encouraged to stop smoking. Antibiotics prescribed symptom care discussed warning signs discussed WSL

## 2016-11-01 ENCOUNTER — Other Ambulatory Visit: Payer: Self-pay | Admitting: *Deleted

## 2016-11-01 ENCOUNTER — Telehealth: Payer: Self-pay | Admitting: *Deleted

## 2016-11-01 MED ORDER — DOXYCYCLINE HYCLATE 100 MG PO TABS: 100.0000 mg | ORAL_TABLET | Freq: Two times a day (BID) | ORAL | 0 refills | Status: DC

## 2016-11-01 NOTE — Telephone Encounter (Signed)
Doxy sent to pharm. Pt notified.

## 2016-11-01 NOTE — Telephone Encounter (Signed)
Pt seen yesterday. Prescribed biaxin. Took at lunch time and last night. Pt states after taking med. Her heart was racing and had some nausea. Has not taken today.  [phone] [facility].

## 2016-11-01 NOTE — Telephone Encounter (Signed)
I dont think biaxin is causing rapid heart rate but we will honor her thoughts they are connected, swith to doxy 100 bid ten d

## 2020-05-28 ENCOUNTER — Encounter: Payer: Self-pay | Admitting: Family Medicine

## 2020-05-28 ENCOUNTER — Ambulatory Visit (INDEPENDENT_AMBULATORY_CARE_PROVIDER_SITE_OTHER): Payer: Self-pay | Admitting: Family Medicine

## 2020-05-28 ENCOUNTER — Other Ambulatory Visit: Payer: Self-pay

## 2020-05-28 VITALS — BP 122/72 | HR 95 | Temp 97.1°F | Ht 64.0 in | Wt 153.8 lb

## 2020-05-28 DIAGNOSIS — W19XXXA Unspecified fall, initial encounter: Secondary | ICD-10-CM | POA: Insufficient documentation

## 2020-05-28 DIAGNOSIS — W19XXXD Unspecified fall, subsequent encounter: Secondary | ICD-10-CM

## 2020-05-28 DIAGNOSIS — M79602 Pain in left arm: Secondary | ICD-10-CM

## 2020-05-28 HISTORY — DX: Unspecified fall, initial encounter: W19.XXXA

## 2020-05-28 NOTE — Progress Notes (Signed)
   Patient ID: Johnnette Litter, female    DOB: [birth year], 39 y.o.   MRN: [id number]   Chief Complaint  Patient presents with  . ER follow up   Subjective:    HPI  Patient fell on left arm at work yesterday and landed on her arm. Patient was seen in ER and told she had no fractures and needs medical note to return to work. Patient using Advil   Medical History Kyran has a past medical history of Anemia and Family history of adverse reaction to anesthesia.   Outpatient Encounter Medications as of 05/28/2020  Medication Sig  . doxycycline (VIBRA-TABS) 100 MG tablet Take 1 tablet (100 mg total) by mouth 2 (two) times daily. (Patient not taking: Reported on 05/28/2020)   No facility-administered encounter medications on file as of 05/28/2020.     Review of Systems Pertinent negatives: no fracture noted on x-ray in ED. Continues to have limited ROM due to soreness.  Vitals BP 122/72   Pulse 95   Temp (!) 97.1 F (36.2 C) (Oral)   Ht 5\' 4"  (1.626 m)   Wt 153 lb 12.8 oz (69.8 kg)   SpO2 98%   BMI 26.40 kg/m   Objective:   Physical Exam Musculoskeletal:        General: Tenderness and signs of injury present.     Comments: Left shoulder injury at work. Mechanism of injury: falling onto shoulder from standing posiiton. X-ray: negative for fracture (including clavicle). Tenderness today is muscular in nature- not over a bony area.No ecchymosis noted.      Assessment and Plan   1. Fall from standing, subsequent encounter   Mayetta presents today for ED follow-up to return to work. She is wearing a sling on left arm today. She is still complaining of tenderness over the trapezius muscle. Wants to return to work at [employer] today for a short shift. I was able to read ED note on [facility] and Samani pulled radiology image on her phone for me to view. Clavicle was included in this view.  Instructions include: 1) Start muscle relaxant that was given at  ED as instructed. 2) Use heating pad alternating with ice pack for 20 minutes every 2 hours. 3) Wear sling as needed, especially at work 4) Return to work with NO LIFTING, until pain/tenderness is improved (note given). 5) Ibuprofen OTC 800 mg every 8 hours as needed for pain, take with food. 6) If pain/tenderness is not improved in one week, return, as you may need another x-ray.  Agrees with plan of care discussed today. This is likely muscular in nature, but a hairline fracture may not show up on x-ray initially.  Understands warning signs to seek further care: increased pain and decreased range of motion.  Understands to follow-up if symptoms do not improve or if anything changes.   [facility], NP

## 2020-05-28 NOTE — Patient Instructions (Addendum)
Start taking the muscle relaxant given by the ED. Use heating pad alternating with ice: 20 minutes every 2 hours. Wear sling at work. Take Ibuprofen 800 mg every 8 hours as needed for pain (take with food). If you are not improving, return for follow-up as you may need a repeat x-ray.      Muscle Strain   A muscle strain is an injury that happens when a muscle is stretched longer than normal. This can happen during a fall, sports, or lifting. This can tear some muscle fibers. Usually, recovery from muscle strain takes 1-2 weeks. Complete healing normally takes 5-6 weeks. This condition is first treated with PRICE therapy. This involves:  Protecting your muscle from being injured again.  Resting your injured muscle.  Icing your injured muscle.  Applying pressure (compression) to your injured muscle. This may be done with a splint or elastic bandage.  Raising (elevating) your injured muscle. Your doctor may also recommend medicine for pain. Follow these instructions at home: If you have a splint:  Wear the splint as told by your doctor. Take it off only as told by your doctor.  Loosen the splint if your fingers or toes tingle, get numb, or turn cold and blue.  Keep the splint clean.  If the splint is not waterproof: ? Do not let it get wet. ? Cover it with a watertight covering when you take a bath or a shower. Managing pain, stiffness, and swelling   If directed, put ice on your injured area. ? If you have a removable splint, take it off as told by your doctor. ? Put ice in a plastic bag. ? Place a towel between your skin and the bag. ? Leave the ice on for 20 minutes, 2-3 times a day.  Move your fingers or toes often. This helps to avoid stiffness and lessen swelling.  Raise your injured area above the level of your heart while you are sitting or lying down.  Wear an elastic bandage as told by your doctor. Make sure it is not too tight. General instructions  Take  over-the-counter and prescription medicines only as told by your doctor.  Limit your activity. Rest your injured muscle as told by your doctor. Your doctor may say that gentle movements are okay.  If physical therapy was prescribed, do exercises as told by your doctor.  Do not put pressure on any part of the splint until it is fully hardened. This may take many hours.  Do not use any products that contain nicotine or tobacco, such as cigarettes and e-cigarettes. These can delay bone healing. If you need help quitting, ask your doctor.  Warm up before you exercise. This helps to prevent more muscle strains.  Ask your doctor when it is safe to drive if you have a splint.  Keep all follow-up visits as told by your doctor. This is important. Contact a doctor if:  You have more pain or swelling in your injured area. Get help right away if:  You have any of these problems in your injured area: ? You have numbness. ? You have tingling. ? You lose a lot of strength. Summary  A muscle strain is an injury that happens when a muscle is stretched longer than normal.  This condition is first treated with PRICE therapy. This includes protecting, resting, icing, adding pressure, and raising your injury.  Limit your activity. Rest your injured muscle as told by your doctor. Your doctor may say that gentle movements  are okay.  Warm up before you exercise. This helps to prevent more muscle strains. This information is not intended to replace advice given to you by your health care provider. Make sure you discuss any questions you have with your health care provider. Document Revised: 11/22/2018 Document Reviewed: 11/02/2016 Elsevier Patient Education  2020 [employer].

## 2020-09-25 ENCOUNTER — Ambulatory Visit (INDEPENDENT_AMBULATORY_CARE_PROVIDER_SITE_OTHER): Payer: BLUE CROSS/BLUE SHIELD | Admitting: Family Medicine

## 2020-09-25 ENCOUNTER — Other Ambulatory Visit: Payer: Self-pay

## 2020-09-25 VITALS — HR 85 | Temp 97.8°F | Resp 16

## 2020-09-25 DIAGNOSIS — B9689 Other specified bacterial agents as the cause of diseases classified elsewhere: Secondary | ICD-10-CM | POA: Insufficient documentation

## 2020-09-25 DIAGNOSIS — J019 Acute sinusitis, unspecified: Secondary | ICD-10-CM

## 2020-09-25 HISTORY — DX: Other specified bacterial agents as the cause of diseases classified elsewhere: B96.89

## 2020-09-25 MED ORDER — AZITHROMYCIN 250 MG PO TABS: ORAL_TABLET | ORAL | 0 refills | Status: DC

## 2020-09-25 NOTE — Progress Notes (Signed)
Patient ID: [name], female    DOB: 1981-01-08, 39 y.o.   MRN: [id number]   Chief Complaint  Patient presents with  . Sinus Problem    Started Wednesday night   Subjective:  CC: sinus problem in head  This is a new problem.  Presents today for an acute visit with a complaint of sinus problems "in head.  Associated symptoms include congestion, cough in the morning, maxillary and frontal sinus pain and pressure.  Symptoms started Wednesday night.  Pertinent negatives include no fever, no chills, no chest pain, no shortness of breath, no sore throat, no ear pain, no abdominal pain.  Has tried over-the-counter Alka-Seltzer cold this is not helping.    Medical History [name] has a past medical history of Anemia and Family history of adverse reaction to anesthesia.   Outpatient Encounter Medications as of 09/25/2020  Medication Sig  . azithromycin (ZITHROMAX) 250 MG tablet Take 2 tablets by mouth today, then one tablet by mouth for four days.  Marland Kitchen doxycycline (VIBRA-TABS) 100 MG tablet Take 1 tablet (100 mg total) by mouth 2 (two) times daily. (Patient not taking: Reported on 05/28/2020)   No facility-administered encounter medications on file as of 09/25/2020.     Review of Systems  Constitutional: Negative for chills and fever.  HENT: Positive for congestion, sinus pressure and sinus pain. Negative for ear pain and sore throat.   Respiratory: Positive for cough. Negative for shortness of breath.   Cardiovascular: Negative for chest pain.  Gastrointestinal: Negative for abdominal pain.     Vitals Pulse 85   Temp 97.8 F (36.6 C)   Resp 16   SpO2 100%   Objective:   Physical Exam Vitals reviewed.  Constitutional:      Appearance: Normal appearance.  HENT:     Right Ear: Tympanic membrane normal.     Left Ear: Tympanic membrane normal.     Nose: Congestion present.     Right Sinus: Maxillary sinus tenderness and frontal sinus tenderness present.     Left  Sinus: Maxillary sinus tenderness and frontal sinus tenderness present.     Mouth/Throat:     Pharynx: Posterior oropharyngeal erythema present. No oropharyngeal exudate.  Cardiovascular:     Rate and Rhythm: Normal rate and regular rhythm.     Heart sounds: Normal heart sounds.  Pulmonary:     Effort: Pulmonary effort is normal.     Breath sounds: Normal breath sounds.  Skin:    General: Skin is warm and dry.  Neurological:     General: No focal deficit present.     Mental Status: She is alert.  Psychiatric:        Behavior: Behavior normal.      Assessment and Plan   1. Acute bacterial rhinosinusitis - azithromycin (ZITHROMAX) 250 MG tablet; Take 2 tablets by mouth today, then one tablet by mouth for four days.  Dispense: 6 tablet; Refill: 0   Has significant maxillary and frontal sinus pain and pressure upon palpation today.  Will treat for acute bacterial rhinosinusitis.  Understands to complete the full course of antibiotics.  Offered COVID-19 testing today, she declines. If her symptoms do not improve with the antibiotic, Covid testing is recommended.  Agrees with plan of care discussed today. Understands warning signs to seek further care: Chest pain, shortness of breath, any significant change in health. Understands to follow-up if symptoms do not improve, or worsen, follow-up at this office, recommend Covid testing.  Dorena Bodo,  FNP-C

## 2020-09-27 ENCOUNTER — Encounter: Payer: Self-pay | Admitting: Family Medicine

## 2020-09-30 ENCOUNTER — Telehealth (INDEPENDENT_AMBULATORY_CARE_PROVIDER_SITE_OTHER): Payer: BLUE CROSS/BLUE SHIELD | Admitting: Family Medicine

## 2020-09-30 ENCOUNTER — Other Ambulatory Visit: Payer: Self-pay

## 2020-09-30 DIAGNOSIS — J019 Acute sinusitis, unspecified: Secondary | ICD-10-CM

## 2020-09-30 MED ORDER — DOXYCYCLINE HYCLATE 100 MG PO TABS: 100.0000 mg | ORAL_TABLET | Freq: Two times a day (BID) | ORAL | 0 refills | Status: DC

## 2020-09-30 NOTE — Progress Notes (Signed)
   Subjective:    Patient ID: [name], female    DOB: October 18, 1980, 39 y.o.   MRN: [id number]  HPI  Virtual Visit via Telephone Note  I connected with [name] on 09/30/20 at  3:50 PM EST by telephone and verified that I am speaking with the correct person using two identifiers.  Location: Patient: home Provider: office    I discussed the limitations, risks, security and privacy concerns of performing an evaluation and management service by telephone and the availability of in person appointments. I also discussed with the patient that there may be a patient responsible charge related to this service. The patient expressed understanding and agreed to proceed.   History of Present Illness:    Observations/Objective:   Assessment and Plan:   Follow Up Instructions:    I discussed the assessment and treatment plan with the patient. The patient was provided an opportunity to ask questions and all were answered. The patient agreed with the plan and demonstrated an understanding of the instructions.   The patient was advised to call back or seek an in-person evaluation if the symptoms worsen or if the condition fails to improve as anticipated.  I provided 20 minutes of non-face-to-face time during this encounter.  Patient seen last week for cough, congestion and sinus pressure and give abx.  Patient states have been significant ongoing congestion drainage coughing losing her voice denies high fever chills sweats denies wheezing difficulty breathing  She finished zpak yesterday was feeling better but woke up today with severe congestion, cough and can barely talk. Using otc cold and cough.  Review of Systems  Constitutional: Negative for activity change and appetite change.  HENT: Positive for congestion, rhinorrhea, sinus pressure and sore throat.   Respiratory: Positive for cough. Negative for shortness of breath.   Cardiovascular: Negative for chest pain and leg  swelling.  Gastrointestinal: Negative for abdominal pain, nausea and vomiting.  Skin: Negative for color change.  Neurological: Negative for dizziness and weakness.  Psychiatric/Behavioral: Negative for agitation and confusion.       Objective:   Physical Exam  Please see above.  Discussion held on the phone with the patient.  Not in respiratory distress.      Assessment & Plan:  Acute rhinosinusitis 10 days worth of antibiotics Warning signs discussed Follow-up if progressive troubles or worse

## 2021-03-16 ENCOUNTER — Telehealth: Payer: Self-pay | Admitting: Nurse Practitioner

## 2021-03-16 ENCOUNTER — Encounter: Payer: Self-pay | Admitting: Nurse Practitioner

## 2021-03-16 DIAGNOSIS — H109 Unspecified conjunctivitis: Secondary | ICD-10-CM

## 2021-03-16 MED ORDER — GENTAMICIN SULFATE 0.3 % OP SOLN: 2.0000 [drp] | OPHTHALMIC | 0 refills | Status: AC

## 2021-03-16 NOTE — Progress Notes (Signed)
[name], [name] are scheduled for a virtual visit with your provider today.    Just as we do with appointments in the office, we must obtain your consent to participate.  Your consent will be active for this visit and any virtual visit you may have with one of our providers in the next 365 days.    If you have a MyChart account, I can also send a copy of this consent to you electronically.  All virtual visits are billed to your insurance company just like a traditional visit in the office.  As this is a virtual visit, video technology does not allow for your provider to perform a traditional examination.  This may limit your provider's ability to fully assess your condition.  If your provider identifies any concerns that need to be evaluated in person or the need to arrange testing such as labs, EKG, etc, we will make arrangements to do so.    Although advances in technology are sophisticated, we cannot ensure that it will always work on either your end or our end.  If the connection with a video visit is poor, we may have to switch to a telephone visit.  With either a video or telephone visit, we are not always able to ensure that we have a secure connection.   I need to obtain your verbal consent now.   Are you willing to proceed with your visit today?   [name] has provided verbal consent on 03/16/2021 for a virtual visit (video or telephone).   Sabrina Simas, FNP 03/16/2021  12:50 PM    Virtual Visit via Video   I connected with patient on 03/16/21 at 12:45 PM EDT by a video enabled telemedicine application and verified that I am speaking with the correct person using two identifiers.  Location patient: Home Location provider: [facility] Persons participating in the virtual visit: Patient, Provider  I discussed the limitations of evaluation and management by telemedicine and the availability of in person appointments. The patient expressed understanding and agreed  to proceed.  Subjective:   HPI:   Patient presents via [facility] today her right eye has been bothering her, pain with discharge- crusted shut this am. She does wear contacts.  Has been using glasses today Denies visual changes  She denies any nasal congestion   Has had pink eye in the past and feels similar  ROS:   See pertinent positives and negatives per HPI. +redness right eye +drainage right eye -visual changes   Patient Active Problem List   Diagnosis Date Noted  . Acute bacterial rhinosinusitis 09/25/2020  . Fall from standing 05/28/2020    Social History   Tobacco Use  . Smoking status: Current Every Day Smoker    Packs/day: 0.25    Years: 22.00    Pack years: 5.50    Types: Cigarettes  . Smokeless tobacco: Never Used  Substance Use Topics  . Alcohol use: Yes    Comment: Occ     Allergies  Allergen Reactions  . Levaquin [Levofloxacin In D5w]     Causes headache and nausea  . Penicillins Hives    In throat Has patient had a PCN reaction causing immediate rash, facial/tongue/throat swelling, SOB or lightheadedness with hypotension: Yes Has patient had a PCN reaction causing severe rash involving mucus membranes or skin necrosis: No Has patient had a PCN reaction that required hospitalization No Has patient had a PCN reaction occurring within the last 10 years: No  If all of the above answers are "NO", then may proceed with Cephalosporin use.   . Sulfa Antibiotics Hives    Objective:   There were no vitals taken for this visit.  Patient is well-developed, well-nourished in no acute distress.  Resting comfortably at home.  Head is normocephalic, atraumatic.  No labored breathing.  Speech is clear and coherent with logical content.  Patient is alert and oriented at baseline.    Assessment and Plan:   History and symptoms consistent with bacterial conjunctivitis.  Will move forward with antibiotic eye drops Patient instructed to use until  symptoms resolve To follow up if symptoms worsen or with new concerns.   Change pillow case/face towel and dispose of liquid eye makeup Wait 24 hours at the earliest before using new contact lenses.  Meds ordered this encounter  Medications  . gentamicin (GARAMYCIN) 0.3 % ophthalmic solution    Sig: Place 2 drops into the right eye every 4 (four) hours while awake for 5 days.    Dispense:  5 mL    Refill:  0    Sabrina Simas, FNP 03/16/2021

## 2021-04-02 ENCOUNTER — Other Ambulatory Visit: Payer: Self-pay

## 2021-04-02 ENCOUNTER — Emergency Department (HOSPITAL_COMMUNITY): Admission: EM | Admit: 2021-04-02 | Discharge: 2021-04-02 | Discharge status: Against Medical Advice | Payer: Self-pay

## 2021-10-08 ENCOUNTER — Telehealth: Payer: Self-pay | Admitting: Family Medicine

## 2021-10-08 NOTE — Telephone Encounter (Signed)
Pt called and stated her husband, Jonny Ruiz, was here earlier this week and she thinks she has the same thing he had. She would like a call from a nurse to obtain medication for herself.  [phone]

## 2021-10-08 NOTE — Telephone Encounter (Signed)
Unfortunately it does not work that way Standard of care state patient needs to be seen Unfortunately we cannot prescribe medications to additional family members even if they are a patient of our practice unless they are seeing Either needs to be seen or urgent care

## 2021-10-08 NOTE — Telephone Encounter (Signed)
Patient notified and verbalized understanding. 

## 2021-10-11 ENCOUNTER — Other Ambulatory Visit: Payer: Self-pay

## 2021-10-11 ENCOUNTER — Ambulatory Visit
Admission: EM | Admit: 2021-10-11 | Discharge: 2021-10-11 | Disposition: A | Discharge status: Home / Self Care | Payer: PRIVATE HEALTH INSURANCE | Attending: Urgent Care | Admitting: Urgent Care

## 2021-10-11 DIAGNOSIS — J069 Acute upper respiratory infection, unspecified: Secondary | ICD-10-CM | POA: Diagnosis not present

## 2021-10-11 DIAGNOSIS — Z87891 Personal history of nicotine dependence: Secondary | ICD-10-CM | POA: Diagnosis not present

## 2021-10-11 DIAGNOSIS — R0981 Nasal congestion: Secondary | ICD-10-CM | POA: Diagnosis not present

## 2021-10-11 MED ORDER — CETIRIZINE HCL 10 MG PO TABS: 10.0000 mg | ORAL_TABLET | Freq: Every day | ORAL | 0 refills | Status: DC

## 2021-10-11 MED ORDER — BENZONATATE 100 MG PO CAPS
100.0000 mg | ORAL_CAPSULE | Freq: Three times a day (TID) | ORAL | 0 refills | Status: DC | PRN
Start: 2021-10-11 — End: 2023-08-18

## 2021-10-11 MED ORDER — PSEUDOEPHEDRINE HCL 60 MG PO TABS: 60.0000 mg | ORAL_TABLET | Freq: Three times a day (TID) | ORAL | 0 refills | Status: DC | PRN

## 2021-10-11 MED ORDER — PROMETHAZINE-DM 6.25-15 MG/5ML PO SYRP: 5.0000 mL | ORAL_SOLUTION | Freq: Every evening | ORAL | 0 refills | Status: DC | PRN

## 2021-10-11 NOTE — ED Provider Notes (Signed)
[facility]   MRN: [id number] DOB: 06-09-1981  Subjective:   [name] is a 41 y.o. female presenting for 3-day history of runny and stuffy nose, postnasal drainage and coughing.  Feels concerned that now the illness is in her chest.  She had 1 sick contact with her husband.  He was seen and tested negative for COVID and flu.  She does not want to be tested for this.  She has been using Alka-Seltzer.  No chest pain, shortness of breath, body aches, fevers.  Patient is a former smoker, quit 2 months ago.  No current facility-administered medications for this encounter. No current outpatient medications on file.   Allergies  Allergen Reactions   Levaquin [Levofloxacin In D5w]     Causes headache and nausea   Penicillins Hives    In throat Has patient had a PCN reaction causing immediate rash, facial/tongue/throat swelling, SOB or lightheadedness with hypotension: Yes Has patient had a PCN reaction causing severe rash involving mucus membranes or skin necrosis: No Has patient had a PCN reaction that required hospitalization No Has patient had a PCN reaction occurring within the last 10 years: No If all of the above answers are "NO", then may proceed with Cephalosporin use.    Sulfa Antibiotics Hives    Past Medical History:  Diagnosis Date   Anemia    Family history of adverse reaction to anesthesia    PONV     Past Surgical History:  Procedure Laterality Date   CESAREAN SECTION     x2   dental extraction     DILITATION & CURRETTAGE/HYSTROSCOPY WITH NOVASURE ABLATION N/A 07/20/2016   Procedure: DILATATION & CURETTAGE/HYSTEROSCOPY WITH NOVASURE ENDOMETRIAL ABLATION;  Surgeon: Lazaro Arms, MD;  Location: [facility];  Service: Gynecology;  Laterality: N/A;    Family History  Problem Relation Age of Onset   Diabetes Maternal Grandmother    Cataracts Maternal Grandmother    Kidney disease Maternal Grandmother    Diabetes Mother    Heart attack Mother      Social History   Tobacco Use   Smoking status: Every Day    Packs/day: 0.25    Years: 22.00    Pack years: 5.50    Types: Cigarettes   Smokeless tobacco: Never  Substance Use Topics   Alcohol use: Yes    Comment: Occ   Drug use: No    ROS   Objective:   Vitals: BP 126/86 (BP Location: Right Arm)    Pulse 92    Temp 99.5 F (37.5 C) (Oral)    Resp 18    LMP  (Within Years) Comment: 4-5 years after ablation   SpO2 98%   Physical Exam Constitutional:      General: She is not in acute distress.    Appearance: Normal appearance. She is well-developed. She is not ill-appearing, toxic-appearing or diaphoretic.  HENT:     Head: Normocephalic and atraumatic.     Right Ear: Tympanic membrane, ear canal and external ear normal. No drainage or tenderness. No middle ear effusion. Tympanic membrane is not erythematous.     Left Ear: Tympanic membrane, ear canal and external ear normal. No drainage or tenderness.  No middle ear effusion. Tympanic membrane is not erythematous.     Nose: Congestion present. No rhinorrhea.     Mouth/Throat:     Mouth: Mucous membranes are moist. No oral lesions.     Pharynx: No pharyngeal swelling, oropharyngeal exudate, posterior oropharyngeal erythema or uvula  swelling.     Tonsils: No tonsillar exudate or tonsillar abscesses.     Comments: Post-nasal drainage overlying pharynx. Eyes:     General: No scleral icterus.       Right eye: No discharge.        Left eye: No discharge.     Extraocular Movements: Extraocular movements intact.     Right eye: Normal extraocular motion.     Left eye: Normal extraocular motion.     Conjunctiva/sclera: Conjunctivae normal.  Cardiovascular:     Rate and Rhythm: Normal rate and regular rhythm.     Pulses: Normal pulses.     Heart sounds: Normal heart sounds. No murmur heard.   No friction rub. No gallop.  Pulmonary:     Effort: Pulmonary effort is normal. No respiratory distress.     Breath sounds: Normal  breath sounds. No stridor. No wheezing, rhonchi or rales.  Musculoskeletal:     Cervical back: Normal range of motion and neck supple.  Lymphadenopathy:     Cervical: No cervical adenopathy.  Skin:    General: Skin is warm and dry.     Findings: No rash.  Neurological:     General: No focal deficit present.     Mental Status: She is alert and oriented to person, place, and time.  Psychiatric:        Mood and Affect: Mood normal.        Behavior: Behavior normal.        Thought Content: Thought content normal.        Judgment: Judgment normal.    Assessment and Plan :   PDMP not reviewed this encounter.  1. Viral upper respiratory tract infection with cough   2. Nasal congestion   3. Former smoker    Patient declined respiratory testing. Deferred imaging given clear cardiopulmonary exam, hemodynamically stable vital signs. Suspect viral URI, viral syndrome. Physical exam findings reassuring and vital signs stable for discharge. Advised supportive care, offered symptomatic relief. Counseled patient on potential for adverse effects with medications prescribed/recommended today, ER and return-to-clinic precautions discussed, patient verbalized understanding.     Wallis Bamberg, [place] 10/11/21 [phone]

## 2021-10-11 NOTE — ED Triage Notes (Signed)
Pt reports nasal congestion, runny nose x 3 days. States she feels congestion is moving top the chest.

## 2021-10-11 NOTE — Discharge Instructions (Signed)
We will manage this as a viral illness. For sore throat or cough try using a honey-based tea. Use 3 teaspoons of honey with juice squeezed from half lemon. Place shaved pieces of ginger into 1/2-1 cup of water and warm over stove top. Then mix the ingredients and repeat every 4 hours as needed. Please take ibuprofen 600mg  every 6 hours with food alternating with OR taken together with Tylenol 500mg -650mg  every 6 hours for throat pain, fevers, aches and pains. Hydrate very well with at least 2 liters of water. Eat light meals such as soups (chicken and noodles, vegetable, chicken and wild rice).  Do not eat foods that you are allergic to.  Taking an antihistamine like Zyrtec can help against postnasal drainage, sinus congestion.  You can take this together with pseudoephedrine (Sudafed) at a dose of 60 mg 3 times a day or twice daily as needed for the same kind of nasal drip, congestion. Use the cough medications as needed.

## 2021-11-09 ENCOUNTER — Ambulatory Visit
Admission: EM | Admit: 2021-11-09 | Discharge: 2021-11-09 | Disposition: A | Discharge status: Home / Self Care | Payer: PRIVATE HEALTH INSURANCE | Attending: Emergency Medicine | Admitting: Emergency Medicine

## 2021-11-09 ENCOUNTER — Encounter: Payer: Self-pay | Admitting: Emergency Medicine

## 2021-11-09 DIAGNOSIS — J988 Other specified respiratory disorders: Secondary | ICD-10-CM | POA: Diagnosis not present

## 2021-11-09 DIAGNOSIS — Z9189 Other specified personal risk factors, not elsewhere classified: Secondary | ICD-10-CM

## 2021-11-09 DIAGNOSIS — B9789 Other viral agents as the cause of diseases classified elsewhere: Secondary | ICD-10-CM

## 2021-11-09 DIAGNOSIS — U071 COVID-19: Secondary | ICD-10-CM

## 2021-11-09 DIAGNOSIS — Z20822 Contact with and (suspected) exposure to covid-19: Secondary | ICD-10-CM | POA: Diagnosis not present

## 2021-11-09 MED ORDER — FLUTICASONE PROPIONATE 50 MCG/ACT NA SUSP: 2.0000 | Freq: Every day | NASAL | 0 refills | Status: DC

## 2021-11-09 NOTE — ED Provider Notes (Signed)
HPI  SUBJECTIVE:  [name] is a 41 y.o. female who presents with fevers to 101, nasal congestion, body aches, clear rhinorrhea, cough starting last night.  No headaches, sore throat, sinus pain or pressure, postnasal drip, facial swelling, upper dental pain, loss of sense of smell or taste, wheezing, shortness of breath, nausea, vomiting, diarrhea, abdominal pain.  No known recent COVID or flu exposure.  She got the second dose of the COVID-vaccine.  She did not get this years flu vaccine.  She took ibuprofen within 6 hours of evaluation.  No antibiotics in the past month.  She has been taking 400 to 600 mg of ibuprofen every 6 hours with fever reduction.  No aggravating factors.  She had a past medical history of COVID in January 22.  LMP: Status post ablation.  Denies the possibility of being pregnant.   Past Medical History:  Diagnosis Date   Anemia    Family history of adverse reaction to anesthesia    PONV    Past Surgical History:  Procedure Laterality Date   CESAREAN SECTION     x2   dental extraction     DILITATION & CURRETTAGE/HYSTROSCOPY WITH NOVASURE ABLATION N/A 07/20/2016   Procedure: DILATATION & CURETTAGE/HYSTEROSCOPY WITH NOVASURE ENDOMETRIAL ABLATION;  Surgeon: Florian Buff, MD;  Location: [facility];  Service: Gynecology;  Laterality: N/A;    Family History  Problem Relation Age of Onset   Diabetes Maternal Grandmother    Cataracts Maternal Grandmother    Kidney disease Maternal Grandmother    Diabetes Mother    Heart attack Mother     Social History   Tobacco Use   Smoking status: Former    Packs/day: 0.25    Years: 22.00    Pack years: 5.50    Types: Cigarettes   Smokeless tobacco: Never  Substance Use Topics   Alcohol use: Yes    Comment: Occ   Drug use: No    No current facility-administered medications for this encounter.  Current Outpatient Medications:    fluticasone (FLONASE) 50 MCG/ACT nasal spray, Place 2 sprays into both nostrils  daily., Disp: 16 g, Rfl: 0   benzonatate (TESSALON) 100 MG capsule, Take 1-2 capsules (100-200 mg total) by mouth 3 (three) times daily as needed for cough., Disp: 60 capsule, Rfl: 0   promethazine-dextromethorphan (PROMETHAZINE-DM) 6.25-15 MG/5ML syrup, Take 5 mLs by mouth at bedtime as needed for cough., Disp: 100 mL, Rfl: 0  Allergies  Allergen Reactions   Levaquin [Levofloxacin In D5w]     Causes headache and nausea   Penicillins Hives    In throat Has patient had a PCN reaction causing immediate rash, facial/tongue/throat swelling, SOB or lightheadedness with hypotension: Yes Has patient had a PCN reaction causing severe rash involving mucus membranes or skin necrosis: No Has patient had a PCN reaction that required hospitalization No Has patient had a PCN reaction occurring within the last 10 years: No If all of the above answers are "NO", then may proceed with Cephalosporin use.    Sulfa Antibiotics Hives     ROS  As noted in HPI.   Physical Exam  BP 111/79 (BP Location: Right Arm)    Pulse 90    Temp 98.3 F (36.8 C) (Oral)    Resp 18    SpO2 98%   Constitutional: Well developed, well nourished, no acute distress Eyes:  EOMI, conjunctiva normal bilaterally HENT: Normocephalic, atraumatic,mucus membranes moist.  Clear nasal congestion.  Erythematous, swollen turbinates.  No  maxillary, frontal sinus tenderness.  Normal tonsils without exudates, uvula midline.  No postnasal drip. Neck: No cervical of adenopathy Respiratory: Normal inspiratory effort, lungs clear bilaterally Cardiovascular: Normal rate, regular rhythm, no murmurs rubs or gallops. GI: nondistended skin: No rash, skin intact Musculoskeletal: no deformities Neurologic: Alert & oriented x 3, no focal neuro deficits Psychiatric: Speech and behavior appropriate   ED Course   Medications - No data to display  Orders Placed This Encounter  Procedures   Covid-19, Flu A+B ([facility])    Standing Status:    Standing    Number of Occurrences:   1   Results for orders placed or performed during the hospital encounter of 11/09/21  Covid-19, Flu A+B ([facility])   Specimen: Nasopharyngeal   Naso  Result Value Ref Range   SARS-CoV-2, NAA Detected (A) Not Detected   Influenza A, NAA Not Detected Not Detected   Influenza B, NAA Not Detected Not Detected   Test Information: Comment      No results found for this or any previous visit (from the past 24 hour(s)). No results found.  ED Clinical Impression  1. COVID-19 virus infection   2. At increased risk of exposure to COVID-19 virus   3. Viral respiratory illness   4. Encounter for laboratory testing for COVID-19 virus      ED Assessment/Plan  Patient with a viral respiratory illness.  COVID, flu sent.  Will prescribe Tamiflu if influenza is positive, and patient will consider antivirals if COVID is positive.  In the meantime, Flonase, saline nasal irrigation, Mucinex D.  She has a prescription of cough syrup already at home, and has plenty of ibuprofen to take 600 ibuprofen/1000 mg Tylenol 3-4 times a day as needed.  She does not need a prescription for this.  11/11/2021 -COVID positive.  Discussed positive result with patient.  Patient declines antivirals.  Supportive treatment.  Discussed labs,  MDM, treatment plan, and plan for follow-up with patient.  patient agrees with plan.   Meds ordered this encounter  Medications   fluticasone (FLONASE) 50 MCG/ACT nasal spray    Sig: Place 2 sprays into both nostrils daily.    Dispense:  16 g    Refill:  0      *This clinic note was created using [employer]. Therefore, there may be occasional mistakes despite careful proofreading.  ?    Melynda Ripple, MD 11/11/21 [phone]

## 2021-11-09 NOTE — ED Triage Notes (Signed)
Fever since last night.  Nasal congestion and cough since last night.

## 2021-11-09 NOTE — Discharge Instructions (Addendum)
Saline nasal irrigation with a NeilMed sinus rinse and distilled water as often as you want., Flonase, Mucinex D for the nasal congestion.  Cough syrup for Tessalon as needed for the cough.  Continue 600 mg of ibuprofen combined with 1000 mg of Tylenol together 3-4 times a day as needed for pain.  We will prescribe Tamiflu if your influenza is positive.  We will also contact you if your COVID is positive, and will consider antivirals at that time.

## 2021-11-10 LAB — COVID-19, FLU A+B NAA
Influenza A, NAA: NOT DETECTED
Influenza B, NAA: NOT DETECTED
SARS-CoV-2, NAA: DETECTED — AB

## 2022-09-28 ENCOUNTER — Ambulatory Visit: Payer: Self-pay

## 2022-09-30 ENCOUNTER — Ambulatory Visit: Payer: Self-pay

## 2023-08-18 ENCOUNTER — Ambulatory Visit: Payer: 59 | Admitting: Nurse Practitioner

## 2023-08-18 ENCOUNTER — Encounter: Payer: Self-pay | Admitting: Nurse Practitioner

## 2023-08-18 VITALS — BP 110/79 | HR 101 | Temp 97.7°F | Ht 64.0 in | Wt 165.2 lb

## 2023-08-18 DIAGNOSIS — S161XXA Strain of muscle, fascia and tendon at neck level, initial encounter: Secondary | ICD-10-CM

## 2023-08-18 DIAGNOSIS — Z1329 Encounter for screening for other suspected endocrine disorder: Secondary | ICD-10-CM

## 2023-08-18 DIAGNOSIS — Z1159 Encounter for screening for other viral diseases: Secondary | ICD-10-CM

## 2023-08-18 DIAGNOSIS — Z114 Encounter for screening for human immunodeficiency virus [HIV]: Secondary | ICD-10-CM

## 2023-08-18 DIAGNOSIS — Z130 Encounter for screening for diseases of the blood and blood-forming organs and certain disorders involving the immune mechanism: Secondary | ICD-10-CM

## 2023-08-18 DIAGNOSIS — Z13228 Encounter for screening for other metabolic disorders: Secondary | ICD-10-CM

## 2023-08-18 DIAGNOSIS — S29012A Strain of muscle and tendon of back wall of thorax, initial encounter: Secondary | ICD-10-CM

## 2023-08-18 DIAGNOSIS — Z13220 Encounter for screening for lipoid disorders: Secondary | ICD-10-CM

## 2023-08-18 HISTORY — DX: Strain of muscle, fascia and tendon at neck level, initial encounter: S16.1XXA

## 2023-08-18 HISTORY — DX: Strain of muscle and tendon of back wall of thorax, initial encounter: S29.012A

## 2023-08-18 NOTE — Progress Notes (Signed)
Subjective:    Patient ID: [name], female    DOB: Dec 25, 1980, 42 y.o.   MRN: [id number]  HPI Presents for complaints of left upper back pain near the scapula for the past 3 weeks.  Describes it as a constant stabbing pain worse with certain movements such as opening and closing the driver door on her vehicle.  Cannot sleep on her left side.  No specific history of injury but noticed it after multiple repetitive uses at work.  Pain began the next day.  Has tried ibuprofen and Tylenol, ice/heat applications.  This has helped some.  Has also noticed some tightness and discomfort on the left lateral neck area.   Review of Systems  Respiratory:  Negative for cough, chest tightness, shortness of breath and wheezing.   Cardiovascular:  Negative for chest pain.  Musculoskeletal:  Positive for back pain and neck pain.      08/18/2023   11:36 AM  Depression screen PHQ 2/9  Decreased Interest 0  Down, Depressed, Hopeless 0  PHQ - 2 Score 0  Altered sleeping 0  Tired, decreased energy 0  Change in appetite 0  Feeling bad or failure about yourself  0  Trouble concentrating 0  Moving slowly or fidgety/restless 0  PHQ-9 Score 0  Difficult doing work/chores Not difficult at all      08/18/2023   11:37 AM  GAD 7 : Generalized Anxiety Score  Nervous, Anxious, on Edge 0  Control/stop worrying 0  Worry too much - different things 0  Trouble relaxing 0  Restless 0  Easily annoyed or irritable 0  Afraid - awful might happen 0  Total GAD 7 Score 0         Objective:   Physical Exam NAD.  Alert, oriented.  Lungs clear.  Heart regular rate rhythm.  Tight tender muscles noted along the left sternocleidomastoid into the area just below the left ear.  Normal ROM of the neck.  TMs are mildly retracted, no erythema.  Distinct tight tender muscles noted along the left rhomboids.  Normal active ROM of the left shoulder.  Mild tenderness in the rhomboid area with certain movements.  No  restrictions.   Today's Vitals   08/18/23 1131  BP: 110/79  Pulse: (!) 101  Temp: 97.7 F (36.5 C)  SpO2: 98%  Weight: 165 lb 3.2 oz (74.9 kg)  Height: 5\' 4"  (1.626 m)   Body mass index is 28.36 kg/m.        Assessment & Plan:   Problem List Items Addressed This Visit       Musculoskeletal and Integument   Strain of left rhomboid muscle - Primary   Strain of sternocleidomastoid muscle   Other Visit Diagnoses     Screening for deficiency anemia       Relevant Orders   CBC with Differential/Platelet   Screening for metabolic disorder       Relevant Orders   Comprehensive metabolic panel   Screening for lipid disorders       Relevant Orders   Lipid panel   Screening for thyroid disorder       Relevant Orders   TSH   Screening for HIV without presence of risk factors       Relevant Orders   HIV Antibody (routine testing w rflx)   Encounter for hepatitis C screening test for low risk patient       Relevant Orders   Hepatitis C antibody  Trial of conservative therapy including: Lidocaine patch Biofreeze Massage therapy TENS unit  Stretching exercises for neck area and massage Call back if no improvement. Routine labs ordered. Recommend yearly wellness exam.

## 2023-08-18 NOTE — Patient Instructions (Addendum)
Lidocaine patch Biofreeze Massage therapy TENS unit  Tdap vaccine

## 2023-08-26 LAB — COMPREHENSIVE METABOLIC PANEL
ALT: 14 [IU]/L (ref 0–32)
AST: 17 [IU]/L (ref 0–40)
Albumin: 4.5 g/dL (ref 3.9–4.9)
Alkaline Phosphatase: 84 [IU]/L (ref 44–121)
BUN/Creatinine Ratio: 10 (ref 9–23)
BUN: 11 mg/dL (ref 6–24)
Bilirubin Total: 0.5 mg/dL (ref 0.0–1.2)
CO2: 22 mmol/L (ref 20–29)
Calcium: 9.7 mg/dL (ref 8.7–10.2)
Chloride: 105 mmol/L (ref 96–106)
Creatinine, Ser: 1.1 mg/dL — ABNORMAL HIGH (ref 0.57–1.00)
Globulin, Total: 2.4 g/dL (ref 1.5–4.5)
Glucose: 91 mg/dL (ref 70–99)
Potassium: 5.3 mmol/L — ABNORMAL HIGH (ref 3.5–5.2)
Sodium: 140 mmol/L (ref 134–144)
Total Protein: 6.9 g/dL (ref 6.0–8.5)
eGFR: 64 mL/min/{1.73_m2} (ref >59)

## 2023-08-26 LAB — CBC WITH DIFFERENTIAL/PLATELET
Basophils Absolute: 0 10*3/uL (ref 0.0–0.2)
Basos: 1 %
EOS (ABSOLUTE): 0 10*3/uL (ref 0.0–0.4)
Eos: 1 %
Hematocrit: 42.9 % (ref 34.0–46.6)
Hemoglobin: 14.2 g/dL (ref 11.1–15.9)
Immature Grans (Abs): 0 10*3/uL (ref 0.0–0.1)
Immature Granulocytes: 1 %
Lymphocytes Absolute: 1.8 10*3/uL (ref 0.7–3.1)
Lymphs: 32 %
MCH: 31.6 pg (ref 26.6–33.0)
MCHC: 33.1 g/dL (ref 31.5–35.7)
MCV: 95 fL (ref 79–97)
Monocytes Absolute: 0.3 10*3/uL (ref 0.1–0.9)
Monocytes: 5 %
Neutrophils Absolute: 3.3 10*3/uL (ref 1.4–7.0)
Neutrophils: 60 %
Platelets: 328 10*3/uL (ref 150–450)
RBC: 4.5 x10E6/uL (ref 3.77–5.28)
RDW: 12.5 % (ref 11.7–15.4)
WBC: 5.4 10*3/uL (ref 3.4–10.8)

## 2023-08-26 LAB — TSH: TSH: 0.805 u[IU]/mL (ref 0.450–4.500)

## 2023-08-26 LAB — LIPID PANEL
Chol/HDL Ratio: 3.9 ratio (ref 0.0–4.4)
Cholesterol, Total: 196 mg/dL (ref 100–199)
HDL: 50 mg/dL (ref >39)
LDL Chol Calc (NIH): 130 mg/dL — ABNORMAL HIGH (ref 0–99)
Triglycerides: 90 mg/dL (ref 0–149)
VLDL Cholesterol Cal: 16 mg/dL (ref 5–40)

## 2023-08-26 LAB — HIV ANTIBODY (ROUTINE TESTING W REFLEX): HIV Screen 4th Generation wRfx: NONREACTIVE

## 2023-08-26 LAB — HEPATITIS C ANTIBODY: Hep C Virus Ab: NONREACTIVE

## 2023-09-18 ENCOUNTER — Ambulatory Visit (INDEPENDENT_AMBULATORY_CARE_PROVIDER_SITE_OTHER): Payer: 59 | Admitting: Nurse Practitioner

## 2023-09-18 ENCOUNTER — Encounter: Payer: Self-pay | Admitting: Nurse Practitioner

## 2023-09-18 VITALS — BP 102/62 | HR 78 | Temp 98.1°F | Ht 64.0 in | Wt 167.0 lb

## 2023-09-18 DIAGNOSIS — Z720 Tobacco use: Secondary | ICD-10-CM | POA: Diagnosis not present

## 2023-09-18 DIAGNOSIS — Z01419 Encounter for gynecological examination (general) (routine) without abnormal findings: Secondary | ICD-10-CM

## 2023-09-18 DIAGNOSIS — Z01411 Encounter for gynecological examination (general) (routine) with abnormal findings: Secondary | ICD-10-CM

## 2023-09-18 DIAGNOSIS — Z124 Encounter for screening for malignant neoplasm of cervix: Secondary | ICD-10-CM

## 2023-09-18 DIAGNOSIS — Z1151 Encounter for screening for human papillomavirus (HPV): Secondary | ICD-10-CM

## 2023-09-18 NOTE — Progress Notes (Signed)
Subjective:    Patient ID: [name], female    DOB: [birth year], 42 y.o.   MRN: [id number]  HPI The patient comes in today for a wellness visit.    A review of their health history was completed.  A review of medications was also completed.  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   Additional concerns: none  Married, same female sexual partner; no cycles due to endometrial ablation Regular eye exams Has dentures and partial. No oral sores or lesions.  Social History   Tobacco Use   Smoking status: Former    Current packs/day: 0.25    Average packs/day: 0.3 packs/day for 22.0 years (5.5 ttl pk-yrs)    Types: Cigarettes   Smokeless tobacco: Never  Vaping Use   Vaping status: Every Day   Substances: Nicotine  Substance Use Topics   Alcohol use: Yes    Comment: Occ   Drug use: No     Review of Systems  Constitutional:  Negative for activity change, appetite change and fatigue.  HENT:  Negative for sore throat and trouble swallowing.   Respiratory:  Negative for cough, chest tightness, shortness of breath and wheezing.   Cardiovascular:  Negative for chest pain.  Gastrointestinal:  Negative for abdominal distention, abdominal pain, constipation, diarrhea, nausea and vomiting.  Genitourinary:  Negative for difficulty urinating, dysuria, enuresis, frequency, genital sores, menstrual problem, pelvic pain, urgency, vaginal bleeding and vaginal discharge.      09/18/2023    8:39 AM  Depression screen PHQ 2/9  Decreased Interest 0  Down, Depressed, Hopeless 0  PHQ - 2 Score 0  Altered sleeping 0  Tired, decreased energy 0  Change in appetite 0  Feeling bad or failure about yourself  0  Trouble concentrating 0   Moving slowly or fidgety/restless 0  Suicidal thoughts 0  PHQ-9 Score 0  Difficult doing work/chores Not difficult at all      09/18/2023    8:39 AM 08/18/2023   11:37 AM  GAD 7 : Generalized Anxiety Score  Nervous, Anxious, on Edge 0 0  Control/stop worrying 0 0  Worry too much - different things 0 0  Trouble relaxing 0 0  Restless 0 0  Easily annoyed or irritable 0 0  Afraid - awful might happen 0 0  Total GAD 7 Score 0 0  Anxiety Difficulty Not difficult at all          Objective:   Physical Exam Vitals and nursing note reviewed. Chaperone present: Defers chaperone.  Constitutional:      General: She is not in acute distress.    Appearance: She is well-developed.  Neck:     Thyroid: No thyromegaly.     Trachea: No tracheal deviation.     Comments: Thyroid non tender to palpation. No mass or goiter noted.  Cardiovascular:     Rate and Rhythm: Normal rate and regular rhythm.     Heart sounds: Normal heart sounds. No murmur heard. Pulmonary:     Effort: Pulmonary effort is normal.     Breath sounds: Normal breath sounds.  Chest:  Breasts:    Right: No swelling, inverted nipple, mass, skin change or tenderness.     Left: No swelling, inverted nipple,  mass, skin change or tenderness.  Abdominal:     General: There is no distension.     Palpations: Abdomen is soft.     Tenderness: There is no abdominal tenderness.  Genitourinary:    General: Normal vulva.     Labia:        Right: No rash, tenderness or lesion.        Left: No rash, tenderness or lesion.      Vagina: No vaginal discharge, erythema, bleeding or lesions.     Cervix: No cervical motion tenderness, discharge, friability, lesion, erythema or cervical bleeding.     Comments: Bimanual exam: no tenderness or obvious masses.  Musculoskeletal:     Cervical back: Normal range of motion and neck supple.  Lymphadenopathy:     Cervical: No cervical adenopathy.     Upper Body:     Right upper body: No  supraclavicular, axillary or pectoral adenopathy.     Left upper body: No supraclavicular, axillary or pectoral adenopathy.  Skin:    General: Skin is warm and dry.     Findings: No rash.  Neurological:     Mental Status: She is alert and oriented to person, place, and time.  Psychiatric:        Mood and Affect: Mood normal.        Behavior: Behavior normal.        Thought Content: Thought content normal.        Judgment: Judgment normal.    Today's Vitals   09/18/23 0836  BP: 102/62  Pulse: 78  Temp: 98.1 F (36.7 C)  SpO2: 99%  Weight: 167 lb (75.8 kg)  Height: 5\' 4"  (1.626 m)   Body mass index is 28.67 kg/m.   Results for orders placed or performed in visit on 08/18/23  CBC with Differential/Platelet  Result Value Ref Range   WBC 5.4 3.4 - 10.8 x10E3/uL   RBC 4.50 3.77 - 5.28 x10E6/uL   Hemoglobin 14.2 11.1 - 15.9 g/dL   Hematocrit 09.8 11.9 - 46.6 %   MCV 95 79 - 97 fL   MCH 31.6 26.6 - 33.0 pg   MCHC 33.1 31.5 - 35.7 g/dL   RDW 14.7 82.9 - 56.2 %   Platelets 328 150 - 450 x10E3/uL   Neutrophils 60 Not Estab. %   Lymphs 32 Not Estab. %   Monocytes 5 Not Estab. %   Eos 1 Not Estab. %   Basos 1 Not Estab. %   Neutrophils Absolute 3.3 1.4 - 7.0 x10E3/uL   Lymphocytes Absolute 1.8 0.7 - 3.1 x10E3/uL   Monocytes Absolute 0.3 0.1 - 0.9 x10E3/uL   EOS (ABSOLUTE) 0.0 0.0 - 0.4 x10E3/uL   Basophils Absolute 0.0 0.0 - 0.2 x10E3/uL   Immature Granulocytes 1 Not Estab. %   Immature Grans (Abs) 0.0 0.0 - 0.1 x10E3/uL  Comprehensive metabolic panel  Result Value Ref Range   Glucose 91 70 - 99 mg/dL   BUN 11 6 - 24 mg/dL   Creatinine, Ser 1.30 (H) 0.57 - 1.00 mg/dL   eGFR 64 >86 VH/QIO/9.62   BUN/Creatinine Ratio 10 9 - 23   Sodium 140 134 - 144 mmol/L   Potassium 5.3 (H) 3.5 - 5.2 mmol/L   Chloride 105 96 - 106 mmol/L   CO2 22 20 - 29 mmol/L   Calcium 9.7 8.7 - 10.2 mg/dL   Total Protein 6.9 6.0 - 8.5 g/dL   Albumin 4.5 3.9 - 4.9 g/dL   Globulin, Total  2.4 1.5  - 4.5 g/dL   Bilirubin Total 0.5 0.0 - 1.2 mg/dL   Alkaline Phosphatase 84 44 - 121 IU/L   AST 17 0 - 40 IU/L   ALT 14 0 - 32 IU/L  Lipid panel  Result Value Ref Range   Cholesterol, Total 196 100 - 199 mg/dL   Triglycerides 90 0 - 149 mg/dL   HDL 50 >40 mg/dL   VLDL Cholesterol Cal 16 5 - 40 mg/dL   LDL Chol Calc (NIH) 981 (H) 0 - 99 mg/dL   Chol/HDL Ratio 3.9 0.0 - 4.4 ratio  TSH  Result Value Ref Range   TSH 0.805 0.450 - 4.500 uIU/mL  HIV Antibody (routine testing w rflx)  Result Value Ref Range   HIV Screen 4th Generation wRfx Non Reactive Non Reactive  Hepatitis C antibody  Result Value Ref Range   Hep C Virus Ab Non Reactive Non Reactive    The 10-year ASCVD risk score (Arnett DK, et al., 2019) is: 0.5%   Values used to calculate the score:     Age: 9 years     Sex: Female     Is Non-Hispanic African American: No     Diabetic: No     Tobacco smoker: No     Systolic Blood Pressure: 102 mmHg     Is BP treated: No     HDL Cholesterol: 50 mg/dL     Total Cholesterol: 196 mg/dL      Assessment & Plan:   Problem List Items Addressed This Visit       Other   Current every day nicotine vaping   Other Visit Diagnoses     Well woman exam    -  Primary   Relevant Orders   IGP, Aptima HPV   Screening for cervical cancer       Relevant Orders   IGP, Aptima HPV   Screening for HPV (human papillomavirus)       Relevant Orders   IGP, Aptima HPV      Reviewed labs with patient during visit.  Discussed skin cancer screening.  Recommend screening mammogram. Given information so patient can schedule.  PAP pending. Discussed vaping; encouraged patient to limit as much as possible. A small cartridge will last about 1 1/2 weeks.  Return in about 1 year (around 09/17/2024) for physical.

## 2023-09-22 LAB — IGP, APTIMA HPV: HPV Aptima: NEGATIVE

## 2024-02-19 ENCOUNTER — Emergency Department (HOSPITAL_COMMUNITY)

## 2024-02-19 ENCOUNTER — Ambulatory Visit: Payer: Self-pay

## 2024-02-19 ENCOUNTER — Encounter (HOSPITAL_COMMUNITY): Payer: Self-pay

## 2024-02-19 ENCOUNTER — Other Ambulatory Visit: Payer: Self-pay

## 2024-02-19 ENCOUNTER — Emergency Department (HOSPITAL_COMMUNITY)
Admission: EM | Admit: 2024-02-19 | Discharge: 2024-02-19 | Disposition: A | Discharge status: Home / Self Care | Attending: Emergency Medicine | Admitting: Emergency Medicine

## 2024-02-19 DIAGNOSIS — R1031 Right lower quadrant pain: Secondary | ICD-10-CM

## 2024-02-19 DIAGNOSIS — K5900 Constipation, unspecified: Secondary | ICD-10-CM | POA: Insufficient documentation

## 2024-02-19 LAB — COMPREHENSIVE METABOLIC PANEL WITH GFR
ALT: 14 U/L (ref 0–44)
AST: 18 U/L (ref 15–41)
Albumin: 3.9 g/dL (ref 3.5–5.0)
Alkaline Phosphatase: 67 U/L (ref 38–126)
Anion gap: 9 (ref 5–15)
BUN: 9 mg/dL (ref 6–20)
CO2: 21 mmol/L — ABNORMAL LOW (ref 22–32)
Calcium: 9 mg/dL (ref 8.9–10.3)
Chloride: 106 mmol/L (ref 98–111)
Creatinine, Ser: 0.86 mg/dL (ref 0.44–1.00)
GFR, Estimated: >60 mL/min (ref >60)
Glucose, Bld: 97 mg/dL (ref 70–99)
Potassium: 3.8 mmol/L (ref 3.5–5.1)
Sodium: 136 mmol/L (ref 135–145)
Total Bilirubin: 0.7 mg/dL (ref 0.0–1.2)
Total Protein: 7 g/dL (ref 6.5–8.1)

## 2024-02-19 LAB — CBC
HCT: 40 % (ref 36.0–46.0)
Hemoglobin: 13.5 g/dL (ref 12.0–15.0)
MCH: 31.5 pg (ref 26.0–34.0)
MCHC: 33.8 g/dL (ref 30.0–36.0)
MCV: 93.5 fL (ref 80.0–100.0)
Platelets: 295 10*3/uL (ref 150–400)
RBC: 4.28 MIL/uL (ref 3.87–5.11)
RDW: 12.7 % (ref 11.5–15.5)
WBC: 4.5 10*3/uL (ref 4.0–10.5)
nRBC: 0 % (ref 0.0–0.2)

## 2024-02-19 LAB — URINALYSIS, ROUTINE W REFLEX MICROSCOPIC
Bilirubin Urine: NEGATIVE
Glucose, UA: NEGATIVE mg/dL
Ketones, ur: NEGATIVE mg/dL
Leukocytes,Ua: NEGATIVE
Nitrite: NEGATIVE
Protein, ur: NEGATIVE mg/dL
Specific Gravity, Urine: 1.002 — ABNORMAL LOW (ref 1.005–1.030)
pH: 6 (ref 5.0–8.0)

## 2024-02-19 LAB — LIPASE, BLOOD: Lipase: 29 U/L (ref 11–51)

## 2024-02-19 LAB — PREGNANCY, URINE: Preg Test, Ur: NEGATIVE

## 2024-02-19 MED ORDER — POLYETHYLENE GLYCOL 3350 17 G PO PACK: 17.0000 g | PACK | Freq: Every day | ORAL | 0 refills | Status: AC | PRN

## 2024-02-19 MED ORDER — KETOROLAC TROMETHAMINE 15 MG/ML IJ SOLN: 15.0000 mg | Freq: Once | INTRAMUSCULAR | Status: DC

## 2024-02-19 NOTE — ED Provider Notes (Signed)
 [place] EMERGENCY DEPARTMENT AT [facility] Provider Note   CSN: [id number] Arrival date & time: 02/19/24  0935     History  Chief Complaint  Patient presents with   Abdominal Pain    [name] is a 43 y.o. female.   Abdominal Pain Patient presents with right abdominal/pelvic pain.  Began rather acutely on Saturday.  No nausea or vomiting or diarrhea.  Does have some slight urinary urgency but no dysuria.  No fevers.  No vaginal bleeding or discharge.  States she does not have menses due to previous uterine ablation.  No fevers.  No nausea or vomiting.    Past Medical History:  Diagnosis Date   Acute bacterial rhinosinusitis 09/25/2020   Anemia    Fall from standing 05/28/2020   Family history of adverse reaction to anesthesia    PONV   Strain of left rhomboid muscle 08/18/2023   Strain of sternocleidomastoid muscle 08/18/2023    Home Medications Prior to Admission medications   Medication Sig Start Date End Date Taking? Authorizing Provider  ibuprofen  (ADVIL ) 200 MG tablet Take 600 mg by mouth every 6 (six) hours as needed for moderate pain (pain score 4-6).   Yes [provider]  polyethylene glycol (MIRALAX) 17 g packet Take 17 g by mouth daily as needed for moderate constipation. 02/19/24  Yes Mozell Arias, MD      Allergies    Levaquin  [levofloxacin  in d5w], Penicillins, and Sulfa antibiotics    Review of Systems   Review of Systems  Gastrointestinal:  Positive for abdominal pain.    Physical Exam Updated Vital Signs BP 105/76 (BP Location: Right Arm)   Pulse 73   Temp 98.3 F (36.8 C) (Oral)   Resp 15   Ht 5\' 4"  (1.626 m)   Wt 76 kg   SpO2 99%   BMI 28.76 kg/m  Physical Exam Vitals and nursing note reviewed.  Abdominal:     Tenderness: There is abdominal tenderness.     Comments: Right lower pelvic pain.  No rebound or guarding.  No mass.  Neurological:     Mental Status: She is alert.     ED Results /  Procedures / Treatments   Labs (all labs ordered are listed, but only abnormal results are displayed) Labs Reviewed  COMPREHENSIVE METABOLIC PANEL WITH GFR - Abnormal; Notable for the following components:      Result Value   CO2 21 (*)    All other components within normal limits  URINALYSIS, ROUTINE W REFLEX MICROSCOPIC - Abnormal; Notable for the following components:   Color, Urine COLORLESS (*)    Specific Gravity, Urine 1.002 (*)    Hgb urine dipstick SMALL (*)    Bacteria, UA RARE (*)    All other components within normal limits  LIPASE, BLOOD  CBC  PREGNANCY, URINE  POC URINE PREG, ED    EKG None  Radiology CT Renal Stone Study Result Date: 02/19/2024 CLINICAL DATA:  Abdominal/flank pain, stone suspected EXAM: CT ABDOMEN AND PELVIS WITHOUT CONTRAST TECHNIQUE: Multidetector CT imaging of the abdomen and pelvis was performed following the standard protocol without IV contrast. RADIATION DOSE REDUCTION: This exam was performed according to the departmental dose-optimization program which includes automated exposure control, adjustment of the mA and/or kV according to patient size and/or use of iterative reconstruction technique. COMPARISON:  02/19/2024 pelvic ultrasound FINDINGS: Of note, the lack of intravenous contrast limits evaluation of the solid organ parenchyma and vascularity. Lower chest: No focal  airspace consolidation or pleural effusion. Hepatobiliary: No mass. Decompressed gallbladder without radiopaque stones or wall thickening. No intrahepatic or extrahepatic biliary ductal dilation. Pancreas: No mass or main ductal dilation. No peripancreatic inflammation or fluid collection. Spleen: Normal size. No mass. Adrenals/Urinary Tract: No adrenal masses. No renal mass. No hydronephrosis or nephrolithiasis. The urinary bladder is distended without focal abnormality. Stomach/Bowel: The stomach is decompressed without focal abnormality. No small bowel wall thickening or  inflammation. No small bowel obstruction.Normal appendix. Moderate volume fecal loading throughout the colon. Vascular/Lymphatic: No aortic aneurysm. No intraabdominal or pelvic lymphadenopathy. Reproductive: The uterus and ovaries are within normal limits for patient's age. Dominant follicle in the left ovary measuring 1.5 cm. No free pelvic fluid. Other: No pneumoperitoneum, ascites, or mesenteric inflammation. Musculoskeletal: No acute fracture or destructive lesion. IMPRESSION: No acute intra-abdominal or pelvic abnormality. Moderate volume fecal loading throughout the colon, which can be seen in constipation. Clinical correlation requested. Electronically Signed   By: Rance Burrows M.D.   On: 02/19/2024 14:13   US  PELVIC COMPLETE W TRANSVAGINAL AND TORSION R/O Result Date: 02/19/2024 CLINICAL DATA:  [id number] Pelvic pain [id number]. EXAM: TRANSABDOMINAL AND TRANSVAGINAL ULTRASOUND OF PELVIS DOPPLER ULTRASOUND OF OVARIES TECHNIQUE: Both transabdominal and transvaginal ultrasound examinations of the pelvis were performed. Transabdominal technique was performed for global imaging of the pelvis including uterus, ovaries, adnexal regions, and pelvic cul-de-sac. It was necessary to proceed with endovaginal exam following the transabdominal exam to visualize the uterus, endometrium, bilateral ovaries and adnexa. Color and duplex Doppler ultrasound was utilized to evaluate blood flow to the ovaries. COMPARISON:  None Available. FINDINGS: Uterus Measurements: 5.0 x 7.1 x 10.2 cm. = volume: 191.9 mL. No fibroids or other mass visualized. Endometrium Thickness: 3.2 mm.  No focal abnormality visualized. Right ovary Measurements: 1.4 x 1.6 x 3.2 cm = volume: 4.1 mL. Normal appearance/no adnexal mass. A probable involuting 5 x 7 x 7 mm cyst noted. Left ovary Measurements: 2.2 x 2.6 x 3.3 cm = volume: 10.2 mL. Normal appearance/no adnexal mass. An anechoic 1.5 x 1.8 x 1.9 cm simple cyst noted. Pulsed Doppler evaluation of both  ovaries demonstrates normal low-resistance arterial and venous waveforms. Other findings No abnormal free fluid. IMPRESSION: *Essentially unremarkable pelvic ultrasound. No sonographic evidence of ovarian torsion. Electronically Signed   By: Beula Brunswick M.D.   On: 02/19/2024 11:25    Procedures Procedures    Medications Ordered in ED Medications  ketorolac  (TORADOL ) 15 MG/ML injection 15 mg (0 mg Intravenous Hold 02/19/24 1329)    ED Course/ Medical Decision Making/ A&P                                 Medical Decision Making Amount and/or Complexity of Data Reviewed Labs: ordered. Radiology: ordered.  Risk OTC drugs. Prescription drug management.   Patient with right side abdominal/pelvic pain.  Worse with lying down better with sitting.  No vaginal bleeding or discharge.  Potential urinary symptoms.  Has had previous kidney stones at this feels different.  Appendicitis felt less likely but considered.  Will get urinalysis pregnancy test and blood work.  Will also get ultrasound to evaluate for causes such as ovarian cyst or torsion.  Ultrasound reassuring.  No clear cause of pain.  Urine does show some hematuria.  Blood work reassuring.  With hematuria and continued pain CT scan done evaluate for cause such as kidney stone.  Stone not seen on CT scan.  Did have constipation.  Will treat as such.  Appears stable for discharge home.        Final Clinical Impression(s) / ED Diagnoses Final diagnoses:  Right lower quadrant abdominal pain  Constipation, unspecified constipation type    Rx / DC Orders ED Discharge Orders          Ordered    polyethylene glycol (MIRALAX) 17 g packet  Daily PRN        02/19/24 1431              Mozell Arias, MD 02/19/24 1531

## 2024-02-19 NOTE — Telephone Encounter (Signed)
 Noted.

## 2024-02-19 NOTE — ED Triage Notes (Signed)
 Pt arrived via POV c/o RLQ abdominal pain since Saturday. Pt denies N/V/D. Pt denies injury.

## 2024-02-19 NOTE — ED Notes (Addendum)
 Pt A&Ox4. Lying in bed watching phone. Stated, "I just came back from CT and it was rough. The pain had calmed down but it's starting to hurt again. I have had a kidney stone before and this doesn't feel like that."   Denies N/V, negative for fever, and pain continues on right lower quadrant.

## 2024-02-19 NOTE — ED Notes (Signed)
 This RN went into pt's room to assess. Pt not present and is over in US  having her procedure done.

## 2024-02-19 NOTE — Telephone Encounter (Signed)
 Copied from CRM [id number]. Topic: Clinical - Red Word Triage >> Feb 19, 2024  8:16 AM Sasha H wrote: [facility] that prompted transfer to Nurse Triage: pt has been having low abdomen pain since Saturday evening, states it is about a 7-8 pain level during the day and a 9 at night when trying to sleep.  Chief Complaint: lower abd pain, worse at night, frequent urination Symptoms: see above Frequency: started Sat. night Pertinent Negatives: Patient denies fever, diarrhea, vomiting Disposition: [x] ED /[] Urgent Care (no appt availability in office) / [] Appointment(In office/virtual)/ []  [employer] Virtual Care/ [] Home Care/ [] Refused Recommended Disposition /[]  Mobile Bus/ []  Follow-up with PCP Additional Notes: per protocol instructed to go to ER.  Pcp office updated.   Reason for Disposition  [1] SEVERE pain (e.g., excruciating) AND [2] present > 1 hour  Answer Assessment - Initial Assessment Questions 1. LOCATION: "Where does it hurt?"      Lower abd pain since Saturday; above groin on right side 2. RADIATION: "Does the pain shoot anywhere else?" (e.g., chest, back)     no 3. ONSET: "When did the pain begin?" (e.g., minutes, hours or days ago)      Saturday 4. SUDDEN: "Gradual or sudden onset?"     suddenly 5. PATTERN "Does the pain come and go, or is it constant?"    - If it comes and goes: "How long does it last?" "Do you have pain now?"     (Note: Comes and goes means the pain is intermittent. It goes away completely between bouts.)    - If constant: "Is it getting better, staying the same, or getting worse?"      (Note: Constant means the pain never goes away completely; most serious pain is constant and gets worse.)      constant 6. SEVERITY: "How bad is the pain?"  (e.g., Scale 1-10; mild, moderate, or severe)    - MILD (1-3): Doesn't interfere with normal activities, abdomen soft and not tender to touch.     - MODERATE (4-7): Interferes with normal activities or awakens  from sleep, abdomen tender to touch.     - SEVERE (8-10): Excruciating pain, doubled over, unable to do any normal activities.       At night it's worse, severe 7. RECURRENT SYMPTOM: "Have you ever had this type of stomach pain before?" If Yes, ask: "When was the last time?" and "What happened that time?"      no 8. CAUSE: "What do you think is causing the stomach pain?"     no 9. RELIEVING/AGGRAVATING FACTORS: "What makes it better or worse?" (e.g., antacids, bending or twisting motion, bowel movement)     Nothing  10. OTHER SYMPTOMS: "Do you have any other symptoms?" (e.g., back pain, diarrhea, fever, urination pain, vomiting)       Frequent urination 11. PREGNANCY: "Is there any chance you are pregnant?" "When was your last menstrual period?"       na  Protocols used: Abdominal Pain - [facility]

## 2024-02-23 ENCOUNTER — Telehealth: Admitting: Family Medicine

## 2024-02-23 DIAGNOSIS — J019 Acute sinusitis, unspecified: Secondary | ICD-10-CM

## 2024-02-23 DIAGNOSIS — B9689 Other specified bacterial agents as the cause of diseases classified elsewhere: Secondary | ICD-10-CM

## 2024-02-23 DIAGNOSIS — J301 Allergic rhinitis due to pollen: Secondary | ICD-10-CM

## 2024-02-23 MED ORDER — DOXYCYCLINE HYCLATE 100 MG PO TABS: 100.0000 mg | ORAL_TABLET | Freq: Two times a day (BID) | ORAL | 0 refills | Status: AC

## 2024-02-23 NOTE — Patient Instructions (Signed)

## 2024-02-23 NOTE — Progress Notes (Signed)
 Virtual Visit Consent   [name], you are scheduled for a virtual visit with a [facility] provider today. Just as with appointments in the office, your consent must be obtained to participate. Your consent will be active for this visit and any virtual visit you may have with one of our providers in the next 365 days. If you have a MyChart account, a copy of this consent can be sent to you electronically.  As this is a virtual visit, video technology does not allow for your provider to perform a traditional examination. This may limit your provider's ability to fully assess your condition. If your provider identifies any concerns that need to be evaluated in person or the need to arrange testing (such as labs, EKG, etc.), we will make arrangements to do so. Although advances in technology are sophisticated, we cannot ensure that it will always work on either your end or our end. If the connection with a video visit is poor, the visit may have to be switched to a telephone visit. With either a video or telephone visit, we are not always able to ensure that we have a secure connection.  By engaging in this virtual visit, you consent to the provision of healthcare and authorize for your insurance to be billed (if applicable) for the services provided during this visit. Depending on your insurance coverage, you may receive a charge related to this service.  I need to obtain your verbal consent now. Are you willing to proceed with your visit today? [name] has provided verbal consent on 02/23/2024 for a virtual visit (video or telephone). Albertha Huger, FNP  Date: 02/23/2024 8:32 AM   Virtual Visit via Video Note   I, Albertha Huger, connected with  [name]  ([id number], 09/05/1981) on 02/23/24 at  8:30 AM EDT by a video-enabled telemedicine application and verified that I am speaking with the correct person using two identifiers.  Location: Patient: Virtual Visit Location  Patient: Home Provider: Virtual Visit Location Provider: Home Office   I discussed the limitations of evaluation and management by telemedicine and the availability of in person appointments. The patient expressed understanding and agreed to proceed.    History of Present Illness: [name] is a 43 y.o. who identifies as a female who was assigned female at birth, and is being seen today for sinus pain and pressure for a week since visiting the ED for kidney stones. Mucus yellow. No fever, wheezing, or cough. Head congestion is worsening. [name]  HPI: HPI  Problems:  Patient Active Problem List   Diagnosis Date Noted   Current every day nicotine vaping 09/18/2023    Allergies:  Allergies  Allergen Reactions   Levaquin  [Levofloxacin  In D5w]     Causes headache and nausea   Penicillins Hives    In throat Has patient had a PCN reaction causing immediate rash, facial/tongue/throat swelling, SOB or lightheadedness with hypotension: Yes Has patient had a PCN reaction causing severe rash involving mucus membranes or skin necrosis: No Has patient had a PCN reaction that required hospitalization No Has patient had a PCN reaction occurring within the last 10 years: No If all of the above answers are "NO", then may proceed with Cephalosporin use.    Sulfa Antibiotics Hives   Medications:  Current Outpatient Medications:    ibuprofen  (ADVIL ) 200 MG tablet, Take 600 mg by mouth every 6 (six) hours as needed for moderate pain (pain score 4-6)., Disp: , Rfl:  polyethylene glycol (MIRALAX) 17 g packet, Take 17 g by mouth daily as needed for moderate constipation., Disp: 14 each, Rfl: 0  Observations/Objective: Patient is well-developed, well-nourished in no acute distress.  Resting comfortably  at home.  Head is normocephalic, atraumatic.  No labored breathing.  Speech is clear and coherent with logical content.  Patient is alert and oriented at baseline.    Assessment and Plan: 1.  Acute bacterial sinusitis (Primary)  2. Seasonal allergic rhinitis due to pollen  Start allegra and flonase , UC if sx persist or worsen   Follow Up Instructions: I discussed the assessment and treatment plan with the patient. The patient was provided an opportunity to ask questions and all were answered. The patient agreed with the plan and demonstrated an understanding of the instructions.  A copy of instructions were sent to the patient via MyChart unless otherwise noted below.     The patient was advised to call back or seek an in-person evaluation if the symptoms worsen or if the condition fails to improve as anticipated.    Sabrina Mckissack, FNP

## 2024-08-12 ENCOUNTER — Telehealth: Payer: Self-pay | Admitting: Family Medicine

## 2024-08-12 ENCOUNTER — Ambulatory Visit: Admitting: Family Medicine

## 2024-08-12 ENCOUNTER — Other Ambulatory Visit (HOSPITAL_COMMUNITY)
Admission: RE | Admit: 2024-08-12 | Discharge: 2024-08-12 | Disposition: A | Discharge status: Home / Self Care | Source: Ambulatory Visit | Attending: Family Medicine | Admitting: Family Medicine

## 2024-08-12 ENCOUNTER — Encounter: Payer: Self-pay | Admitting: Family Medicine

## 2024-08-12 ENCOUNTER — Ambulatory Visit: Payer: Self-pay | Admitting: Family Medicine

## 2024-08-12 VITALS — BP 123/65 | HR 94 | Temp 98.1°F | Ht 64.0 in | Wt 174.0 lb

## 2024-08-12 DIAGNOSIS — H3412 Central retinal artery occlusion, left eye: Secondary | ICD-10-CM | POA: Insufficient documentation

## 2024-08-12 DIAGNOSIS — E785 Hyperlipidemia, unspecified: Secondary | ICD-10-CM | POA: Diagnosis not present

## 2024-08-12 DIAGNOSIS — R519 Headache, unspecified: Secondary | ICD-10-CM

## 2024-08-12 LAB — CBC WITH DIFFERENTIAL/PLATELET
Abs Immature Granulocytes: 0.03 K/uL (ref 0.00–0.07)
Basophils Absolute: 0 K/uL (ref 0.0–0.1)
Basophils Relative: 1 %
Eosinophils Absolute: 0.1 K/uL (ref 0.0–0.5)
Eosinophils Relative: 1 %
HCT: 40.7 % (ref 36.0–46.0)
Hemoglobin: 13.6 g/dL (ref 12.0–15.0)
Immature Granulocytes: 0 %
Lymphocytes Relative: 34 %
Lymphs Abs: 2.9 K/uL (ref 0.7–4.0)
MCH: 31.3 pg (ref 26.0–34.0)
MCHC: 33.4 g/dL (ref 30.0–36.0)
MCV: 93.8 fL (ref 80.0–100.0)
Monocytes Absolute: 0.5 K/uL (ref 0.1–1.0)
Monocytes Relative: 6 %
Neutro Abs: 5.2 K/uL (ref 1.7–7.7)
Neutrophils Relative %: 58 %
Platelets: 317 K/uL (ref 150–400)
RBC: 4.34 MIL/uL (ref 3.87–5.11)
RDW: 12.5 % (ref 11.5–15.5)
WBC: 8.7 K/uL (ref 4.0–10.5)
nRBC: 0 % (ref 0.0–0.2)

## 2024-08-12 LAB — LIPID PANEL
Cholesterol: 182 mg/dL (ref 0–200)
HDL: 45 mg/dL (ref >40)
LDL Cholesterol: 82 mg/dL (ref 0–99)
Total CHOL/HDL Ratio: 4.1 ratio
Triglycerides: 278 mg/dL — ABNORMAL HIGH (ref <150)
VLDL: 56 mg/dL — ABNORMAL HIGH (ref 0–40)

## 2024-08-12 LAB — C-REACTIVE PROTEIN: CRP: 1.8 mg/dL — ABNORMAL HIGH (ref <1.0)

## 2024-08-12 LAB — SEDIMENTATION RATE: Sed Rate: 10 mm/h (ref 0–20)

## 2024-08-12 NOTE — Telephone Encounter (Signed)
 Please set up appointment with ophthalmology Dr. Redell Hans with [facility]  Reason retinal central artery occlusion-now resolved

## 2024-08-12 NOTE — Progress Notes (Signed)
 Subjective:    Patient ID: [name], female    DOB: 08-17-1981, 43 y.o.   MRN: [id number]  HPI  Lost vision in L eye for 30 mins - not comp dark not comp bright Headache after occurence Discussed the use of AI scribe software for clinical note transcription with the patient, who gave verbal consent to proceed.  History of Present Illness   [name] is a 43 year old female who presents with sudden vision loss in the left eye.  She experienced sudden vision loss in her left eye while sweeping the floor in her camper. The vision loss was described as a 'grayish color' rather than complete darkness, lasting approximately 30 to 45 minutes. Her vision began to return, initially foggy, and took about 40 minutes to fully recover. During the episode, covering her right eye did not improve vision in the left eye, and covering the left eye did not result in darkness.  No associated nausea, severe pain, or vomiting during the vision loss episode. However, about an hour and a half to two hours after regaining her vision, she experienced a headache described as a 'throb' on the right side of her head, not behind the eye, which resolved after taking two ibuprofen . The following morning, she felt 'not quite right,' with a sensation of her 'head is in a bowl,' but denies any numbness or weakness on one side of the body.  Over the past six months, she has experienced sporadic episodes where her eyes feel like they 'don't want to work together,' resulting in blurry vision but not double vision. These episodes are brief, lasting only a few seconds, and occur randomly without a predictable pattern.  She has a history of vaping, having quit smoking two and a half years ago, and currently uses a vape infrequently, with one lasting her three to four weeks. She lives across the street from [facility] and works at [employer] on [address], which she finds less stressful with more predictable hours.       Review of Systems     Objective:   Physical Exam General-in no acute distress Eyes-no discharge Lungs-respiratory rate normal, CTA CV-no murmurs,RRR Extremities skin warm dry no edema Neuro grossly normal Behavior normal, alert Peripheral vision normal Vision in both eyes normal Pupils responsive EOMI       Assessment & Plan:  Assessment and Plan    Transient monocular vision loss, left eye Acute transient monocular vision loss in the left eye, resolved spontaneously. Differential includes central retinal artery occlusion. Evaluated for recurrence risk and prevention. - Referred to ophthalmology for further evaluation. - Ordered carotid ultrasound to assess blood flow. - Ordered brain scan to rule out small strokes. - Ordered blood tests for inflammatory markers and vasculitis. - Advised 81 mg aspirin daily. - Instructed to go to [facility] ER if vision loss recurs and does not resolve immediately.  Headache following transient vision loss Headache post-vision loss, resolved with rest and ibuprofen . Differential includes vasculitis or small strokes. - Ordered brain scan to rule out small strokes. - Ordered blood tests for inflammatory markers and vasculitis. - Advised to monitor for recurrence of headache or vision loss.      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 Hyperlipidemia, unspecified hyperlipidemia type Lab ordered await results - Lipid Profile  3. Acute nonintractable  headache, unspecified headache type Lab ordered await results I doubt she has had a hemorrhage But we need to rule out the possibility of a neurologic issue including even pseudotumor cerebri as well as possibility of mini strokes - Sed Rate (ESR) - C-reactive protein - CBC with Differential/Platelet - Lipid Profile - US  Carotid Duplex Bilateral - MR Brain W Wo Contrast - Ambulatory referral to Ophthalmology - VAS US  CAROTID

## 2024-08-13 ENCOUNTER — Ambulatory Visit (HOSPITAL_COMMUNITY)
Admission: RE | Admit: 2024-08-13 | Discharge: 2024-08-13 | Disposition: A | Discharge status: Home / Self Care | Source: Ambulatory Visit | Attending: Family Medicine | Admitting: Family Medicine

## 2024-08-13 ENCOUNTER — Other Ambulatory Visit: Payer: Self-pay

## 2024-08-13 ENCOUNTER — Ambulatory Visit (HOSPITAL_COMMUNITY): Admission: RE | Admit: 2024-08-13

## 2024-08-13 ENCOUNTER — Telehealth: Payer: Self-pay

## 2024-08-13 DIAGNOSIS — R519 Headache, unspecified: Secondary | ICD-10-CM | POA: Diagnosis present

## 2024-08-13 DIAGNOSIS — H3412 Central retinal artery occlusion, left eye: Secondary | ICD-10-CM | POA: Insufficient documentation

## 2024-08-13 MED ORDER — GADOBUTROL 1 MMOL/ML IV SOLN
7.5000 mL | Freq: Once | INTRAVENOUS | Status: AC | PRN
  Administered 2024-08-13: 7.5 mL via INTRAVENOUS

## 2024-08-13 NOTE — Telephone Encounter (Signed)
 Copied from CRM [id number]. Topic: Clinical - Request for Lab/Test Order >> Aug 13, 2024  8:44 AM [name] wrote: Reason for CRM: Patient referred for ultrasound of carotid artery but it was canceled due to know pre-auth. Patient also schedule for an MRI today which is approved per radiology. Ultrasound needs pre-auth. Patient CB#  [phone]

## 2024-08-13 NOTE — Telephone Encounter (Signed)
 Copied from CRM #[id number]. Topic: Clinical - Medication Prior Auth >> Aug 12, 2024  4:25 PM Delon DASEN wrote: Reason for CRM: PA needed for US  that is scheduled for tomorrow afternoon

## 2024-08-14 NOTE — Telephone Encounter (Signed)
 Auth in place.  Called and rescheduled Appt - Patient is aware of new appt information.

## 2024-08-15 ENCOUNTER — Ambulatory Visit: Payer: Self-pay | Admitting: Family Medicine

## 2024-08-15 ENCOUNTER — Ambulatory Visit (HOSPITAL_COMMUNITY)
Admission: RE | Admit: 2024-08-15 | Discharge: 2024-08-15 | Disposition: A | Discharge status: Home / Self Care | Source: Ambulatory Visit | Attending: Family Medicine | Admitting: Family Medicine

## 2024-08-15 DIAGNOSIS — H3412 Central retinal artery occlusion, left eye: Secondary | ICD-10-CM | POA: Diagnosis present

## 2024-08-15 DIAGNOSIS — R519 Headache, unspecified: Secondary | ICD-10-CM | POA: Diagnosis present

## 2024-08-17 NOTE — Telephone Encounter (Signed)
 This patient does need an appointment-I had spoken previously with Dr. Valdemar in his office they stated they would be willing to see her and schedule her somewhere in the next several weeks-please see what referral team has to say-if I need to call to help set up the appointment I can do so-please give some feedback regarding this thank you

## 2024-08-20 ENCOUNTER — Encounter (HOSPITAL_COMMUNITY): Payer: Self-pay

## 2024-08-20 ENCOUNTER — Emergency Department (HOSPITAL_COMMUNITY)

## 2024-08-20 ENCOUNTER — Emergency Department (HOSPITAL_COMMUNITY)
Admission: EM | Admit: 2024-08-20 | Discharge: 2024-08-21 | Disposition: A | Discharge status: Home / Self Care | Attending: Emergency Medicine | Admitting: Emergency Medicine

## 2024-08-20 ENCOUNTER — Other Ambulatory Visit: Payer: Self-pay

## 2024-08-20 DIAGNOSIS — D72829 Elevated white blood cell count, unspecified: Secondary | ICD-10-CM | POA: Diagnosis not present

## 2024-08-20 DIAGNOSIS — R1022 Pelvic and perineal pain left side: Secondary | ICD-10-CM | POA: Insufficient documentation

## 2024-08-20 HISTORY — DX: Calculus of kidney: N20.0

## 2024-08-20 LAB — CBC WITH DIFFERENTIAL/PLATELET
Abs Immature Granulocytes: 0.06 K/uL (ref 0.00–0.07)
Basophils Absolute: 0 K/uL (ref 0.0–0.1)
Basophils Relative: 0 %
Eosinophils Absolute: 0 K/uL (ref 0.0–0.5)
Eosinophils Relative: 0 %
HCT: 35 % — ABNORMAL LOW (ref 36.0–46.0)
Hemoglobin: 11.8 g/dL — ABNORMAL LOW (ref 12.0–15.0)
Immature Granulocytes: 1 %
Lymphocytes Relative: 10 %
Lymphs Abs: 1.2 K/uL (ref 0.7–4.0)
MCH: 31.6 pg (ref 26.0–34.0)
MCHC: 33.7 g/dL (ref 30.0–36.0)
MCV: 93.6 fL (ref 80.0–100.0)
Monocytes Absolute: 0.6 K/uL (ref 0.1–1.0)
Monocytes Relative: 5 %
Neutro Abs: 10 K/uL — ABNORMAL HIGH (ref 1.7–7.7)
Neutrophils Relative %: 84 %
Platelets: 249 K/uL (ref 150–400)
RBC: 3.74 MIL/uL — ABNORMAL LOW (ref 3.87–5.11)
RDW: 12.4 % (ref 11.5–15.5)
WBC: 11.8 K/uL — ABNORMAL HIGH (ref 4.0–10.5)
nRBC: 0 % (ref 0.0–0.2)

## 2024-08-20 LAB — COMPREHENSIVE METABOLIC PANEL WITH GFR
ALT: 14 U/L (ref 0–44)
AST: 25 U/L (ref 15–41)
Albumin: 4.3 g/dL (ref 3.5–5.0)
Alkaline Phosphatase: 78 U/L (ref 38–126)
Anion gap: 13 (ref 5–15)
BUN: 11 mg/dL (ref 6–20)
CO2: 23 mmol/L (ref 22–32)
Calcium: 9 mg/dL (ref 8.9–10.3)
Chloride: 105 mmol/L (ref 98–111)
Creatinine, Ser: 0.91 mg/dL (ref 0.44–1.00)
GFR, Estimated: >60 mL/min (ref >60)
Glucose, Bld: 112 mg/dL — ABNORMAL HIGH (ref 70–99)
Potassium: 3.9 mmol/L (ref 3.5–5.1)
Sodium: 141 mmol/L (ref 135–145)
Total Bilirubin: 0.3 mg/dL (ref 0.0–1.2)
Total Protein: 6.8 g/dL (ref 6.5–8.1)

## 2024-08-20 LAB — MAGNESIUM: Magnesium: 2.4 mg/dL (ref 1.7–2.4)

## 2024-08-20 MED ORDER — KETOROLAC TROMETHAMINE 15 MG/ML IJ SOLN
15.0000 mg | Freq: Once | INTRAMUSCULAR | Status: AC
  Administered 2024-08-20: 15 mg via INTRAVENOUS
  Filled 2024-08-20: qty 1

## 2024-08-20 MED ORDER — HYDROMORPHONE HCL 1 MG/ML IJ SOLN
0.5000 mg | Freq: Once | INTRAMUSCULAR | Status: DC
  Filled 2024-08-20: qty 0.5

## 2024-08-20 MED ORDER — ONDANSETRON HCL 4 MG/2ML IJ SOLN
4.0000 mg | Freq: Once | INTRAMUSCULAR | Status: AC
  Administered 2024-08-20: 4 mg via INTRAVENOUS
  Filled 2024-08-20: qty 2

## 2024-08-20 MED ORDER — LACTATED RINGERS IV BOLUS
1000.0000 mL | Freq: Once | INTRAVENOUS | Status: AC
  Administered 2024-08-20: 1000 mL via INTRAVENOUS

## 2024-08-20 NOTE — ED Notes (Signed)
Pt ambulated to treatment room with steady gait

## 2024-08-20 NOTE — ED Provider Notes (Signed)
 [place] EMERGENCY DEPARTMENT AT [facility] Provider Note   CSN: [id number] Arrival date & time: 08/20/24  2134     Patient presents with: Flank Pain (Left sided)   [name] is a 43 y.o. female.  {Add pertinent medical, surgical, social history, OB history to HPI:32947}  Flank Pain Associated symptoms include abdominal pain.  Patient presents for left flank pain.  Medical history includes anemia, vaping.  Onset of pain was this morning.  It worsened this evening.  She does have a remote history of nephrolithiasis.  She has had recent urinary frequency.  For the past 24 hours, she has had a left lower quadrant abdominal pain.  Pain became severe this evening.  She has had nausea without vomiting.  Current pain is 8/10 in severity.     Prior to Admission medications   Medication Sig Start Date End Date Taking? Authorizing Provider  ibuprofen  (ADVIL ) 200 MG tablet Take 600 mg by mouth every 6 (six) hours as needed for moderate pain (pain score 4-6).    [provider]  polyethylene glycol (MIRALAX ) 17 g packet Take 17 g by mouth daily as needed for moderate constipation. 02/19/24   Patsey Lot, MD    Allergies: Levaquin  [levofloxacin  in d5w], Penicillins, and Sulfa antibiotics    Review of Systems  Gastrointestinal:  Positive for abdominal pain and nausea.  Genitourinary:  Positive for flank pain.  All other systems reviewed and are negative.   Updated Vital Signs BP 129/88   Pulse 94   Temp 98.5 F (36.9 C) (Oral)   Resp 18   Ht 5' 4 (1.626 m)   Wt 78.9 kg   SpO2 100%   BMI 29.87 kg/m   Physical Exam Vitals and nursing note reviewed.  Constitutional:      General: She is not in acute distress.    Appearance: Normal appearance. She is well-developed. She is not ill-appearing, toxic-appearing or diaphoretic.  HENT:     Head: Normocephalic and atraumatic.     Right Ear: External ear normal.     Left Ear: External ear normal.      Nose: Nose normal.     Mouth/Throat:     Mouth: Mucous membranes are moist.  Eyes:     Extraocular Movements: Extraocular movements intact.     Conjunctiva/sclera: Conjunctivae normal.  Cardiovascular:     Rate and Rhythm: Normal rate and regular rhythm.  Pulmonary:     Effort: Pulmonary effort is normal. No respiratory distress.  Abdominal:     Palpations: Abdomen is soft.     Tenderness: There is abdominal tenderness. There is no guarding or rebound.  Musculoskeletal:        General: No swelling. Normal range of motion.     Cervical back: Normal range of motion and neck supple.  Skin:    General: Skin is warm and dry.     Coloration: Skin is not jaundiced or pale.  Neurological:     General: No focal deficit present.     Mental Status: She is alert and oriented to person, place, and time.  Psychiatric:        Mood and Affect: Mood normal.        Behavior: Behavior normal.     (all labs ordered are listed, but only abnormal results are displayed) Labs Reviewed  URINALYSIS, ROUTINE W REFLEX MICROSCOPIC  COMPREHENSIVE METABOLIC PANEL WITH GFR  CBC WITH DIFFERENTIAL/PLATELET  POC URINE PREG, ED    EKG: None  Radiology: No results found.  {Document cardiac monitor, telemetry assessment procedure when appropriate:32947} Procedures   Medications Ordered in the ED  ketorolac  (TORADOL ) 15 MG/ML injection 15 mg (has no administration in time range)      {Click here for ABCD2, HEART and other calculators REFRESH Note before signing:1}                              Medical Decision Making Amount and/or Complexity of Data Reviewed Labs: ordered.   This patient presents to the ED for concern of ***, this involves an extensive number of treatment options, and is a complaint that carries with it a high risk of complications and morbidity.  The differential diagnosis includes ***   Co morbidities / Chronic conditions that complicate the patient  evaluation  ***   Additional history obtained:  Additional history obtained from EMR External records from outside source obtained and reviewed including ***   Lab Tests:  I Ordered, and personally interpreted labs.  The pertinent results include:  ***   Imaging Studies ordered:  I ordered imaging studies including ***  I independently visualized and interpreted imaging which showed *** I agree with the radiologist interpretation   Cardiac Monitoring: / EKG:  The patient was maintained on a cardiac monitor.  I personally viewed and interpreted the cardiac monitored which showed an underlying rhythm of: ***   Problem List / ED Course / Critical interventions / Medication management  Patient presenting for left lower quadrant pain for the past 24 hours, worsened in severity tonight.  On arrival in the ED, vital signs are normal.  She denies any recent fevers.  She is overall well-appearing on exam.  She does have some tenderness to the left lower quadrant.  Pain and nausea medication ordered.  Workup initiated.*** I ordered medication including ***   Reevaluation of the patient after these medicines showed that the patient *** I have reviewed the patients home medicines and have made adjustments as needed   Consultations Obtained:  I requested consultation with the ***,  and discussed lab and imaging findings as well as pertinent plan - they recommend: ***   Social Determinants of Health:  ***   Test / Admission - Considered:  ***   {Document critical care time when appropriate  Document review of labs and clinical decision tools ie CHADS2VASC2, etc  Document your independent review of radiology images and any outside records  Document your discussion with family members, caretakers and with consultants  Document social determinants of health affecting pt's care  Document your decision making why or why not admission, treatments were needed:32947:::1}   Final  diagnoses:  None    ED Discharge Orders     None

## 2024-08-20 NOTE — ED Triage Notes (Signed)
 Pt to ED with c/o left sided flank pain that started this morning, pt says the pain got worse about 30 minutes ago after putting heating pad on LLQ, pt says she had kidney stones 20 years. Pt also reports frequent urination.

## 2024-08-21 ENCOUNTER — Emergency Department (HOSPITAL_COMMUNITY)

## 2024-08-21 LAB — URINALYSIS, ROUTINE W REFLEX MICROSCOPIC
Bilirubin Urine: NEGATIVE
Glucose, UA: NEGATIVE mg/dL
Ketones, ur: NEGATIVE mg/dL
Leukocytes,Ua: NEGATIVE
Nitrite: NEGATIVE
Protein, ur: NEGATIVE mg/dL
Specific Gravity, Urine: 1.024 (ref 1.005–1.030)
pH: 5 (ref 5.0–8.0)

## 2024-08-21 LAB — POC URINE PREG, ED: Preg Test, Ur: NEGATIVE

## 2024-08-21 NOTE — Discharge Instructions (Addendum)
 Your test results today are reassuring.  Take ibuprofen  and/or Tylenol  as needed for pain.  Take several doses of over-the-counter MiraLAX  for bowel cleanout.  Return to the emergency department for any new or worsening symptoms of concern.

## 2024-08-21 NOTE — ED Notes (Signed)
 This RN asked pt if she could provide a urine sample. Pt reported that she could not urinate at this time.

## 2024-08-21 NOTE — ED Notes (Signed)
 Patient transported to CT

## 2024-09-03 NOTE — Progress Notes (Signed)
 [facility] - Clinic Note  09/17/2024   CHIEF COMPLAINT Patient presents for Retina Evaluation  HISTORY OF PRESENT ILLNESS: [name] is a 43 y.o. female who presents to the clinic today for:  HPI     Retina Evaluation   In both eyes.  I, the attending physician,  performed the HPI with the patient and updated documentation appropriately.        Comments   Pt states the first week of November she lost vision in her left eye- lasted about 30 minutes but didn't get full vision until about 45 minutes after incident. Pt denies FOL/Floaters. Pt denies high BP or headaches before or after incident.       Last edited by Valdemar Rogue, MD on 09/22/2024  2:20 PM.     Pt states that she was sweeping a floor and suddenly lost vision in left eye and it took 45 min to come back, occurring 1st Sunday of Nov. Vision in [facility] 'went grey' did not black out. OS only. VA gradually returned. No issues since. Pt reports no medical history. Pt is not medicated for any health issues.    Referring physician: Alphonsa Glendia LABOR, MD [address] [place],  [place] [address]  HISTORICAL INFORMATION:  Selected notes from the MEDICAL RECORD NUMBER Referred by Dr. Alphonsa for visual disturbance / episode of decreased vision OS LEE:  Ocular Hx- PMH-   CURRENT MEDICATIONS: No current outpatient medications on file. (Ophthalmic Drugs)   No current facility-administered medications for this visit. (Ophthalmic Drugs)   Current Outpatient Medications (Other)  Medication Sig   ibuprofen  (ADVIL ) 200 MG tablet Take 600 mg by mouth every 6 (six) hours as needed for moderate pain (pain score 4-6).   polyethylene glycol (MIRALAX ) 17 g packet Take 17 g by mouth daily as needed for moderate constipation.   No current facility-administered medications for this visit. (Other)   REVIEW OF SYSTEMS: ROS   Negative for: Constitutional, Gastrointestinal, Neurological, Skin,  Genitourinary, Musculoskeletal, HENT, Endocrine, Cardiovascular, Eyes, Respiratory, Psychiatric, Allergic/Imm, Heme/Lymph Last edited by Ashley Beards D on 09/17/2024  9:26 AM.     ALLERGIES Allergies  Allergen Reactions   Levaquin  [Levofloxacin  In D5w]     Causes headache and nausea   Penicillins Hives    In throat Has patient had a PCN reaction causing immediate rash, facial/tongue/throat swelling, SOB or lightheadedness with hypotension: Yes Has patient had a PCN reaction causing severe rash involving mucus membranes or skin necrosis: No Has patient had a PCN reaction that required hospitalization No Has patient had a PCN reaction occurring within the last 10 years: No If all of the above answers are NO, then may proceed with Cephalosporin use.    Sulfa Antibiotics Hives   PAST MEDICAL HISTORY Past Medical History:  Diagnosis Date   Acute bacterial rhinosinusitis 09/25/2020   Anemia    Fall from standing 05/28/2020   Family history of adverse reaction to anesthesia    PONV   Kidney stones    Strain of left rhomboid muscle 08/18/2023   Strain of sternocleidomastoid muscle 08/18/2023   Past Surgical History:  Procedure Laterality Date   CESAREAN SECTION     x2   dental extraction     DILITATION & CURRETTAGE/HYSTROSCOPY WITH NOVASURE ABLATION N/A 07/20/2016   Procedure: DILATATION & CURETTAGE/HYSTEROSCOPY WITH NOVASURE ENDOMETRIAL ABLATION;  Surgeon: Vonn VEAR Inch, MD;  Location: [facility];  Service: Gynecology;  Laterality: N/A;   FAMILY HISTORY  Family History  Problem Relation Age of Onset   Diabetes Maternal Grandmother    Cataracts Maternal Grandmother    Kidney disease Maternal Grandmother    Diabetes Mother    Heart attack Mother    SOCIAL HISTORY Social History   Tobacco Use   Smoking status: Former    Current packs/day: 0.25    Average packs/day: 0.3 packs/day for 22.0 years (5.5 ttl pk-yrs)    Types: Cigarettes   Smokeless tobacco: Never  Vaping Use    Vaping status: Every Day   Substances: Nicotine  Substance Use Topics   Alcohol use: Yes    Comment: Occ   Drug use: No       OPHTHALMIC EXAM:  Base Eye Exam     Visual Acuity (Snellen - Linear)       Right Left   Dist cc 20/25 20/25   Dist ph cc NI NI    Correction: Glasses         Tonometry (Tonopen, 9:20 AM)       Right Left   Pressure 15 14         Pupils       Pupils Dark Light Shape React APD   Right PERRL 3 2 Round Brisk None   Left PERRL 3 2 Round Brisk None         Visual Fields       Left Right    Full Full         Extraocular Movement       Right Left    Full, Ortho Full, Ortho         Neuro/Psych     Oriented x3: Yes         Dilation     Both eyes: 1.0% Mydriacyl, 2.5% Phenylephrine @ 9:21 AM           Slit Lamp and Fundus Exam     Slit Lamp Exam       Right Left   Lids/Lashes Normal Normal   Conjunctiva/Sclera White and quiet White and quiet   Cornea Clear Clear   Anterior Chamber Deep and quiet Deep and quiet   Iris Round and dilated Round and dilated   Lens Clear Clear   Anterior Vitreous mild syneresis mild syneresis         Fundus Exam       Right Left   Disc Pink and Sharp, Compact Pink and Sharp, Compact   C/D Ratio 0.1 0.2   Macula Flat, Good foveal reflex, No heme or edema Flat, Good foveal reflex, No heme or edema   Vessels mild attenuation Normal   Periphery Attached, no heme Attached, no heme           Refraction     Wearing Rx       Sphere   Right -4.50   Left -3.75           IMAGING AND PROCEDURES  Imaging and Procedures for 09/17/2024  OCT, Retina - OU - Both Eyes       Right Eye Quality was good. Central Foveal Thickness: 260. Progression has no prior data. Findings include normal foveal contour, no IRF, no SRF, vitreomacular adhesion .   Left Eye Quality was good. Central Foveal Thickness: 260. Progression has no prior data. Findings include normal foveal contour,  no IRF, no SRF, vitreomacular adhesion .   Notes *Images captured and stored on drive  Diagnosis / Impression:  NFP, no IRF/SRF OU  Clinical management:  See below  Abbreviations: NFP - Normal foveal profile. CME - cystoid macular edema. PED - pigment epithelial detachment. IRF - intraretinal fluid. SRF - subretinal fluid. EZ - ellipsoid zone. ERM - epiretinal membrane. ORA - outer retinal atrophy. ORT - outer retinal tubulation. SRHM - subretinal hyper-reflective material. IRHM - intraretinal hyper-reflective material         MRI brain w/wo contrast 11.04.25 IMPRESSION: 1. Small focal area of increased T2 signal in the right frontal corona radiata. 2. No abnormal parenchymal or meningeal enhancement.  Carotid Ultrasound 11.07.25 IMPRESSION: 1. No significant carotid atherosclerosis. Negative for stenosis. 2. Normal antegrade vertebral flow bilaterally.     ASSESSMENT/PLAN:   ICD-10-CM   1. Visual disturbance  H53.9 OCT, Retina - OU - Both Eyes     Visual Distrubance OS - Pt reports temporary vision loss 11.2.25 where vision in OS 'went grey' but gradually returned over a course of 45 minutes. Pt was sweeping a floor when vision change occurred.  - No significant medical history, pt does not take any prescribed medications - initially presented to Dr. Alphonsa who ordered MRI and carotid dopplers -- both essentially normal - BCVA OS 20/25 - no findings or pathologies on exam to explain visual episode - OCT shows NFP, no IRF/SRF - ? Ocular migraine - no retinal or ophthalmic interventions indicated or recommended  - f/u here prn   Ophthalmic Meds Ordered this visit:  No orders of the defined types were placed in this encounter.    Return if symptoms worsen or fail to improve.  There are no Patient Instructions on file for this visit.  Explained the diagnoses, plan, and follow up with the patient and they expressed understanding.  Patient expressed understanding of the  importance of proper follow up care.   This document serves as a record of services personally performed by Redell JUDITHANN Hans, MD, PhD. It was created on their behalf by Wanda GEANNIE Keens, COT an ophthalmic technician. The creation of this record is the provider's dictation and/or activities during the visit.    Electronically signed by:  Wanda GEANNIE Keens, COT  09/22/2024 2:20 PM  This document serves as a record of services personally performed by Redell JUDITHANN Hans, MD, PhD. It was created on their behalf by Almetta Pesa, an ophthalmic technician. The creation of this record is the provider's dictation and/or activities during the visit.    Electronically signed by: Almetta Pesa, OA, 09/22/2024  2:20 PM  Redell JUDITHANN Hans, M.D., Ph.D. Diseases & Surgery of the Retina and Vitreous [facility] 09/17/2024  I have reviewed the above documentation for accuracy and completeness, and I agree with the above. Redell JUDITHANN Hans, M.D., Ph.D. 09/22/2024 2:26 PM   Abbreviations: M myopia (nearsighted); A astigmatism; H hyperopia (farsighted); P presbyopia; Mrx spectacle prescription;  CTL contact lenses; OD right eye; OS left eye; OU both eyes  XT exotropia; ET esotropia; PEK punctate epithelial keratitis; PEE punctate epithelial erosions; DES dry eye syndrome; MGD meibomian gland dysfunction; ATs artificial tears; PFAT's preservative free artificial tears; NSC nuclear sclerotic cataract; PSC posterior subcapsular cataract; ERM epi-retinal membrane; PVD posterior vitreous detachment; RD retinal detachment; DM diabetes mellitus; DR diabetic retinopathy; NPDR non-proliferative diabetic retinopathy; PDR proliferative diabetic retinopathy; CSME clinically significant macular edema; DME diabetic macular edema; dbh dot blot hemorrhages; CWS cotton wool spot; POAG primary open angle glaucoma; C/D cup-to-disc ratio; HVF humphrey visual field; GVF goldmann visual field; OCT optical coherence  tomography;  IOP intraocular pressure; BRVO Branch retinal vein occlusion; CRVO central retinal vein occlusion; CRAO central retinal artery occlusion; BRAO branch retinal artery occlusion; RT retinal tear; SB scleral buckle; PPV pars plana vitrectomy; VH Vitreous hemorrhage; PRP panretinal laser photocoagulation; IVK intravitreal kenalog; VMT vitreomacular traction; MH Macular hole;  NVD neovascularization of the disc; NVE neovascularization elsewhere; AREDS age related eye disease study; ARMD age related macular degeneration; POAG primary open angle glaucoma; EBMD epithelial/anterior basement membrane dystrophy; ACIOL anterior chamber intraocular lens; IOL intraocular lens; PCIOL posterior chamber intraocular lens; Phaco/IOL phacoemulsification with intraocular lens placement; PRK photorefractive keratectomy; LASIK laser assisted in situ keratomileusis; HTN hypertension; DM diabetes mellitus; COPD chronic obstructive pulmonary disease

## 2024-09-17 ENCOUNTER — Encounter (INDEPENDENT_AMBULATORY_CARE_PROVIDER_SITE_OTHER): Payer: Self-pay | Admitting: Ophthalmology

## 2024-09-17 ENCOUNTER — Ambulatory Visit (INDEPENDENT_AMBULATORY_CARE_PROVIDER_SITE_OTHER): Admitting: Ophthalmology

## 2024-09-17 DIAGNOSIS — H3581 Retinal edema: Secondary | ICD-10-CM

## 2024-09-17 DIAGNOSIS — H539 Unspecified visual disturbance: Secondary | ICD-10-CM | POA: Diagnosis not present

## 2024-09-22 ENCOUNTER — Encounter (INDEPENDENT_AMBULATORY_CARE_PROVIDER_SITE_OTHER): Payer: Self-pay | Admitting: Ophthalmology

## 2024-09-22 NOTE — Addendum Note (Signed)
 Addended by: Gwenette Wellons G on: 09/22/2024 02:38 PM   Modules accepted: Level of Service

## 2024-10-24 ENCOUNTER — Ambulatory Visit: Admitting: Nurse Practitioner

## 2024-10-24 VITALS — BP 109/77 | HR 102 | Temp 98.7°F | Ht 64.0 in | Wt 173.6 lb

## 2024-10-24 DIAGNOSIS — K5904 Chronic idiopathic constipation: Secondary | ICD-10-CM

## 2024-10-24 DIAGNOSIS — R1030 Lower abdominal pain, unspecified: Secondary | ICD-10-CM

## 2024-10-24 DIAGNOSIS — R319 Hematuria, unspecified: Secondary | ICD-10-CM | POA: Diagnosis not present

## 2024-10-24 LAB — POCT URINALYSIS DIP (CLINITEK)
Glucose, UA: NEGATIVE mg/dL
Ketones, POC UA: NEGATIVE mg/dL
Leukocytes, UA: NEGATIVE
Nitrite, UA: NEGATIVE
POC PROTEIN,UA: NEGATIVE
Spec Grav, UA: <=1.005 — AB
Urobilinogen, UA: 0.2 U/dL — AB
pH, UA: 6

## 2024-10-24 MED ORDER — DICYCLOMINE HCL 10 MG PO CAPS: ORAL_CAPSULE | ORAL | 0 refills | Status: AC

## 2024-10-25 ENCOUNTER — Encounter: Payer: Self-pay | Admitting: Nurse Practitioner

## 2024-10-25 NOTE — Progress Notes (Signed)
 "  Subjective:    Patient ID: [name], female    DOB: 11-May-1981, 44 y.o.   MRN: [id number]  Abdominal Pain   Discussed the use of AI scribe software for clinical note transcription with the patient, who gave verbal consent to proceed.  History of Present Illness [name] is a 44 year old female who presents with recurrent lower abdominal pain.  She has been experiencing recurrent lower abdominal pain since May, initially presenting on May 12th. The pain is described as a 'knife cutting' sensation located from hip to hip, above the C-section line but below the umbilicus, without radiation to the back or upper abdomen. The pain was severe enough to disrupt sleep, prompting an emergency room visit where she underwent internal and external ultrasounds, a CT scan, and blood work. She was told she was constipated and was advised to take Miralax  and ibuprofen .  She followed the Miralax  regimen for a week, resulting in softer stools, although she was already having regular bowel movements. Despite this, she continued to experience episodes of pain, with a significant episode occurring in November, leading to another emergency room visit. The tests repeated the same findings, with the addition of a small uterine fibroid noted.   Between May and November, she used Miralax  intermittently, about once or twice a week, and maintained regular bowel movements twice daily. Despite this, she was told by doctors that she still had mild constipation on subsequent evaluations. She experiences episodes of pain sporadically, with no specific triggers identified, and has attempted to correlate these episodes with dietary intake without success. The pain can last from six to twenty-four hours and is sometimes alleviated by ibuprofen  or Tylenol , allowing her to function.  Her past medical history includes a uterine ablation performed nine to eleven years ago, after which she has not had menstrual cycles.  She denies any cyclical pain, vaginal bleeding, or discharge. She has had her tubes tied, eliminating concerns about pregnancy. She reports no urinary symptoms such as burning, urgency, or frequency, and denies any fever, blood in stools, or changes in stool color. She experiences occasional urinary incontinence when sneezing, attributed to having three children.  Socially, she has been active, walking regularly with her job, and has attempted to increase her water intake despite disliking water. She has reduced caffeine intake, opting for decaffeinated coffee and tea, and limits herself to one diet soft drink per day. She has been with the same female sexual partner and has no concerns about sexually transmitted diseases.     10/24/2024    1:30 PM  Depression screen PHQ 2/9  Decreased Interest 0  Down, Depressed, Hopeless 0  PHQ - 2 Score 0  Altered sleeping 1  Tired, decreased energy 0  Change in appetite 0  Feeling bad or failure about yourself  0  Trouble concentrating 0  Moving slowly or fidgety/restless 0  Suicidal thoughts 0  PHQ-9 Score 1  Difficult doing work/chores Not difficult at all      10/24/2024    1:30 PM 09/18/2023    8:39 AM 08/18/2023   11:37 AM  GAD 7 : Generalized Anxiety Score  Nervous, Anxious, on Edge 0 0 0  Control/stop worrying 0 0 0  Worry too much - different things 0 0 0  Trouble relaxing 0 0 0  Restless 0 0 0  Easily annoyed or irritable 0 0 0  Afraid - awful might happen 0 0 0  Total GAD 7 Score 0  0 0  Anxiety Difficulty Not difficult at all Not difficult at all     Social History[1]      Objective:   Physical Exam NAD.  Alert, oriented.  Calm affect.  Lungs clear.  Heart regular rate rhythm.  Abdomen soft nondistended with active bowel sounds x 4.  Minimal lower abdominal tenderness towards the left lower quadrant.  No rebound or guarding.  No obvious masses.  No significant tenderness noted on exam. Today's Vitals   10/24/24 1329  BP: 109/77   Pulse: (!) 102  Temp: 98.7 F (37.1 C)  SpO2: 98%  Weight: 173 lb 9.6 oz (78.7 kg)  Height: 5' 4 (1.626 m)   Body mass index is 29.8 kg/m. Results for orders placed or performed in visit on 10/24/24  POCT URINALYSIS DIP (CLINITEK)   Collection Time: 10/24/24  2:02 PM  Result Value Ref Range   Color, UA light yellow (A) yellow   Clarity, UA clear clear   Glucose, UA negative negative mg/dL   Bilirubin, UA small (A) negative   Ketones, POC UA negative negative mg/dL   Spec Grav, UA <=8.994 (A) 1.010 - 1.025   Blood, UA trace-intact (A) negative   pH, UA 6.0 5.0 - 8.0   POC PROTEIN,UA negative negative, trace   Urobilinogen, UA 0.2 (A) 0.2 or 1.0 E.U./dL   Nitrite, UA Negative Negative   Leukocytes, UA Negative Negative          Assessment & Plan:  1. Lower abdominal pain (Primary) Chronic lower abdominal pain with moderate colonic stool burden. Differential includes bowel spasms, bladder spasms, or interstitial cystitis. Ibuprofen  provides partial relief. - Referred to gastroenterology for evaluation and colonoscopy. - Prescribed Bentyl  for bowel spasms, four times daily as needed. - Continue ibuprofen  and Tylenol  for pain. - Monitor for fever, urinary symptoms, or bowel habit changes.  - dicyclomine  (BENTYL ) 10 MG capsule; Take one capsule po QID Prn abdominal spasms  Dispense: 40 capsule; Refill: 0 - Ambulatory referral to Gastroenterology  2. Hematuria, unspecified type - Obtained urine sample for dipstick analysis. - Monitor for new urinary symptoms or visible blood. - Recommend physical with pelvic exam to look for sources of blood in urine. May need further work up.   - POCT URINALYSIS DIP (CLINITEK)  3. Chronic idiopathic constipation  - Ambulatory referral to Gastroenterology  Return for office visit for physical . Call back sooner if worse.       [1]  Social History Tobacco Use   Smoking status: Former    Current packs/day: 0.25    Average  packs/day: 0.3 packs/day for 22.0 years (5.5 ttl pk-yrs)    Types: Cigarettes   Smokeless tobacco: Never  Vaping Use   Vaping status: Every Day   Substances: Nicotine  Substance Use Topics   Alcohol use: Yes    Comment: Occ   Drug use: No   "

## 2024-11-06 ENCOUNTER — Encounter: Payer: Self-pay | Admitting: Gastroenterology

## 2024-11-12 ENCOUNTER — Ambulatory Visit: Admitting: Nurse Practitioner

## 2024-11-12 VITALS — BP 116/80 | HR 87 | Temp 97.9°F | Ht 64.0 in | Wt 172.4 lb

## 2024-11-12 DIAGNOSIS — Z720 Tobacco use: Secondary | ICD-10-CM | POA: Diagnosis not present

## 2024-11-12 DIAGNOSIS — Z1231 Encounter for screening mammogram for malignant neoplasm of breast: Secondary | ICD-10-CM

## 2024-11-12 DIAGNOSIS — Z01419 Encounter for gynecological examination (general) (routine) without abnormal findings: Secondary | ICD-10-CM

## 2024-11-12 NOTE — Patient Instructions (Signed)
 Please call [facility]Bone density scheduling [phone]

## 2024-11-13 ENCOUNTER — Encounter: Payer: Self-pay | Admitting: Nurse Practitioner

## 2024-11-25 ENCOUNTER — Ambulatory Visit (HOSPITAL_COMMUNITY)

## 2024-11-26 ENCOUNTER — Ambulatory Visit: Admitting: Gastroenterology
